# Patient Record
Sex: Male | Born: 1935
Health system: Southern US, Community
[De-identification: ages and names within clinical notes are randomized; demographics above are authoritative.]

## PROBLEM LIST (undated history)

## (undated) DIAGNOSIS — C61 Malignant neoplasm of prostate: Secondary | ICD-10-CM

## (undated) DIAGNOSIS — K219 Gastro-esophageal reflux disease without esophagitis: Secondary | ICD-10-CM

## (undated) DIAGNOSIS — F32A Depression, unspecified: Secondary | ICD-10-CM

## (undated) DIAGNOSIS — I10 Essential (primary) hypertension: Secondary | ICD-10-CM

## (undated) DIAGNOSIS — J40 Bronchitis, not specified as acute or chronic: Secondary | ICD-10-CM

## (undated) DIAGNOSIS — F329 Major depressive disorder, single episode, unspecified: Secondary | ICD-10-CM

## (undated) DIAGNOSIS — E7800 Pure hypercholesterolemia, unspecified: Secondary | ICD-10-CM

## (undated) DIAGNOSIS — E119 Type 2 diabetes mellitus without complications: Secondary | ICD-10-CM

## (undated) DIAGNOSIS — F419 Anxiety disorder, unspecified: Secondary | ICD-10-CM

## (undated) DIAGNOSIS — N200 Calculus of kidney: Secondary | ICD-10-CM

## (undated) HISTORY — PX: OTHER SURGICAL HISTORY: SHX169

## (undated) HISTORY — DX: Pure hypercholesterolemia, unspecified: E78.00

## (undated) HISTORY — DX: Malignant neoplasm of prostate: C61

## (undated) HISTORY — PX: TONSILLECTOMY: SUR1361

## (undated) HISTORY — DX: Type 2 diabetes mellitus without complications: E11.9

## (undated) HISTORY — DX: Essential (primary) hypertension: I10

## (undated) HISTORY — PX: KIDNEY STONE SURGERY: SHX686

## (undated) HISTORY — DX: Calculus of kidney: N20.0

---

## 2010-06-10 ENCOUNTER — Encounter: Admission: RE | Admit: 2010-06-10 | Discharge: 2010-06-10 | Payer: Self-pay | Admitting: Orthopedic Surgery

## 2011-03-10 DIAGNOSIS — E7800 Pure hypercholesterolemia, unspecified: Secondary | ICD-10-CM | POA: Insufficient documentation

## 2011-03-10 DIAGNOSIS — E1141 Type 2 diabetes mellitus with diabetic mononeuropathy: Secondary | ICD-10-CM | POA: Insufficient documentation

## 2011-03-10 DIAGNOSIS — I10 Essential (primary) hypertension: Secondary | ICD-10-CM

## 2011-03-10 DIAGNOSIS — N200 Calculus of kidney: Secondary | ICD-10-CM

## 2011-03-10 DIAGNOSIS — E1140 Type 2 diabetes mellitus with diabetic neuropathy, unspecified: Secondary | ICD-10-CM | POA: Insufficient documentation

## 2011-03-10 DIAGNOSIS — C61 Malignant neoplasm of prostate: Secondary | ICD-10-CM | POA: Insufficient documentation

## 2011-08-04 IMAGING — CT CT EXTREM UP W/O CM*L*
2 of 4 series · 4 of 14 positions shown, 5 images · non-contrast
Comparison: None.

CLINICAL DATA: Status post fall 06/07/2010.  Evaluate proximal
left humeral fracture.  History of prostate cancer.

CT OF THE LEFT SHOULDER WITHOUT CONTRAST
TECHNIQUE: Multidetector CT imaging of the left shoulder was
performed according to the standard protocol without intravenous
contrast. Multiplanar CT image reconstructions were also generated.

[Series 3: shoulder bone · axial · 0.49mm/px · z∈[-156,-79]mm · 2 of 94 slices shown, 3 images]
[im 32/94  soft-tissue]
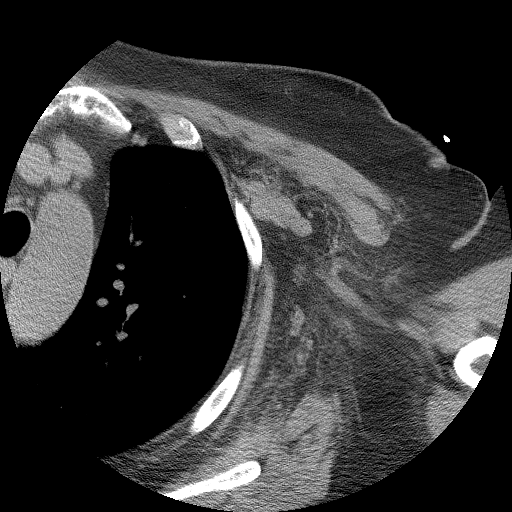
[im 32/94  bone]
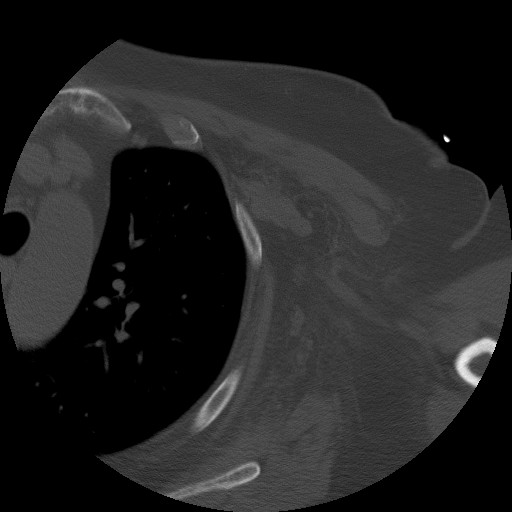
[im 63/94  bone]
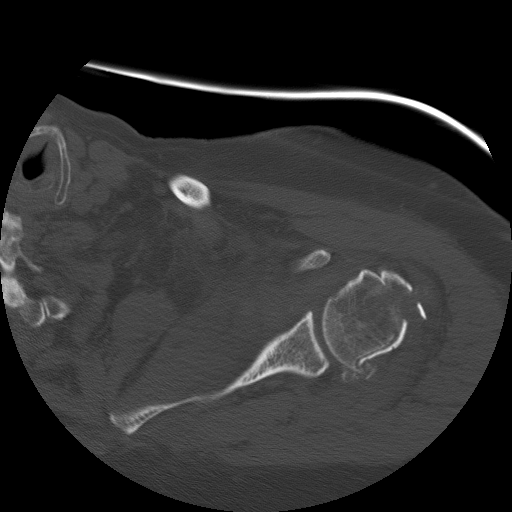

[Series 103: sag obl · sagittal · 0.53mm/px · 2 of 112 slices shown]
[im 38/112  bone]
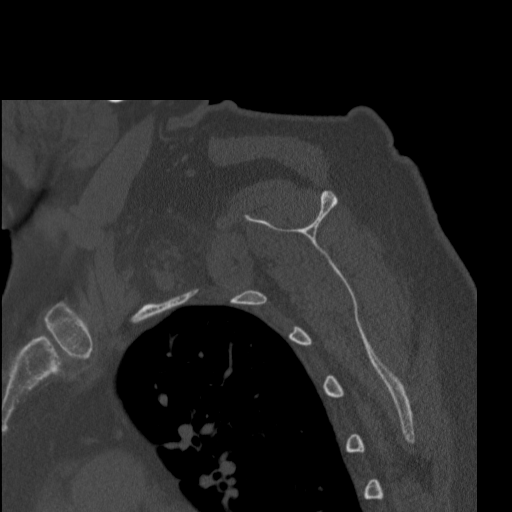
[im 75/112  bone]
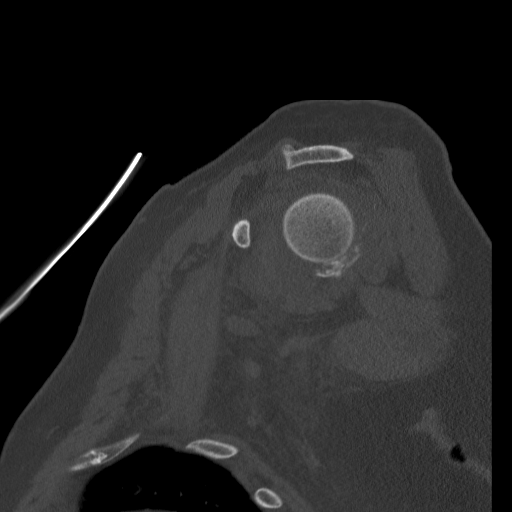

[4 of 14 positions shown; findings below may reference images not displayed]

FINDINGS: There is a comminuted fracture of the left humeral neck
and proximal diaphysis.  Components involving the greater
tuberosity are mildly displaced.  No significant involvement of the
articular surface of the humeral head is demonstrated.  There are
small intra-articular fracture fragments within the axillary
recess.

There is no evidence of scapular or clavicle fracture.  There is no
humeral head subluxation.  There is some soft tissue swelling
surrounding the left shoulder joint.

No upper rib fractures are identified.  There is a tiny nodule in
the lingula adjacent to one of the pulmonary vessels, measuring 4
mm on image 77.  The lungs are otherwise clear.
IMPRESSION: 1.  Comminuted and mildly displaced fracture of the left humeral
neck as described.  No significant involvement of the articular
surface of the humeral head is identified.
2.  There are small fracture fragments within the joint.
3.  No evidence of scapular or clavicle fracture.
4.  Tiny lingular nodule is of doubtful significance.  If the
patient is at high risk for bronchogenic carcinoma, follow-up chest
CT at 1 year is recommended.  If the patient is at low risk, no
follow-up is needed.  This recommendation follows the consensus
statement: Guidelines for Management of Small Pulmonary Nodules
Detected on CT Scans:  A Statement from the [HOSPITAL] as
[URL] No
sclerotic bone lesions are identified.

3-DIMENSIONAL CT IMAGE RENDERING AT INDEPENDENT WORKSTATION:

3-dimensional CT images were rendered by post-processing of the
original CT data at independent workstation.  The 3-dimensional CT
images were interpreted, and findings were reported in the
accompanying complete CT report for this study.

## 2011-08-18 DIAGNOSIS — L608 Other nail disorders: Secondary | ICD-10-CM | POA: Diagnosis not present

## 2011-08-18 DIAGNOSIS — E1149 Type 2 diabetes mellitus with other diabetic neurological complication: Secondary | ICD-10-CM | POA: Diagnosis not present

## 2011-10-20 DIAGNOSIS — M1710 Unilateral primary osteoarthritis, unspecified knee: Secondary | ICD-10-CM | POA: Diagnosis not present

## 2011-10-20 DIAGNOSIS — IMO0002 Reserved for concepts with insufficient information to code with codable children: Secondary | ICD-10-CM | POA: Diagnosis not present

## 2011-11-01 DIAGNOSIS — Z6838 Body mass index (BMI) 38.0-38.9, adult: Secondary | ICD-10-CM | POA: Diagnosis not present

## 2011-11-01 DIAGNOSIS — E119 Type 2 diabetes mellitus without complications: Secondary | ICD-10-CM | POA: Diagnosis not present

## 2011-11-01 DIAGNOSIS — E785 Hyperlipidemia, unspecified: Secondary | ICD-10-CM | POA: Diagnosis not present

## 2011-11-01 DIAGNOSIS — IMO0001 Reserved for inherently not codable concepts without codable children: Secondary | ICD-10-CM | POA: Diagnosis not present

## 2011-11-01 DIAGNOSIS — Z79899 Other long term (current) drug therapy: Secondary | ICD-10-CM | POA: Diagnosis not present

## 2011-11-01 DIAGNOSIS — G4700 Insomnia, unspecified: Secondary | ICD-10-CM | POA: Diagnosis not present

## 2011-11-17 DIAGNOSIS — IMO0002 Reserved for concepts with insufficient information to code with codable children: Secondary | ICD-10-CM | POA: Diagnosis not present

## 2011-11-17 DIAGNOSIS — R233 Spontaneous ecchymoses: Secondary | ICD-10-CM | POA: Diagnosis not present

## 2011-11-17 DIAGNOSIS — M1710 Unilateral primary osteoarthritis, unspecified knee: Secondary | ICD-10-CM | POA: Diagnosis not present

## 2011-11-17 DIAGNOSIS — L821 Other seborrheic keratosis: Secondary | ICD-10-CM | POA: Diagnosis not present

## 2011-11-17 DIAGNOSIS — L820 Inflamed seborrheic keratosis: Secondary | ICD-10-CM | POA: Diagnosis not present

## 2011-12-01 DIAGNOSIS — E1149 Type 2 diabetes mellitus with other diabetic neurological complication: Secondary | ICD-10-CM | POA: Diagnosis not present

## 2011-12-01 DIAGNOSIS — L608 Other nail disorders: Secondary | ICD-10-CM | POA: Diagnosis not present

## 2012-02-08 DIAGNOSIS — E119 Type 2 diabetes mellitus without complications: Secondary | ICD-10-CM | POA: Diagnosis not present

## 2012-02-08 DIAGNOSIS — C61 Malignant neoplasm of prostate: Secondary | ICD-10-CM | POA: Diagnosis not present

## 2012-02-08 DIAGNOSIS — I10 Essential (primary) hypertension: Secondary | ICD-10-CM | POA: Diagnosis not present

## 2012-02-08 DIAGNOSIS — Z6838 Body mass index (BMI) 38.0-38.9, adult: Secondary | ICD-10-CM | POA: Diagnosis not present

## 2012-02-08 DIAGNOSIS — E785 Hyperlipidemia, unspecified: Secondary | ICD-10-CM | POA: Diagnosis not present

## 2012-02-21 DIAGNOSIS — C61 Malignant neoplasm of prostate: Secondary | ICD-10-CM | POA: Diagnosis not present

## 2012-02-28 DIAGNOSIS — C61 Malignant neoplasm of prostate: Secondary | ICD-10-CM | POA: Diagnosis not present

## 2012-02-28 DIAGNOSIS — Z87442 Personal history of urinary calculi: Secondary | ICD-10-CM | POA: Diagnosis not present

## 2012-03-01 DIAGNOSIS — L608 Other nail disorders: Secondary | ICD-10-CM | POA: Diagnosis not present

## 2012-03-01 DIAGNOSIS — E1149 Type 2 diabetes mellitus with other diabetic neurological complication: Secondary | ICD-10-CM | POA: Diagnosis not present

## 2012-03-19 DIAGNOSIS — E119 Type 2 diabetes mellitus without complications: Secondary | ICD-10-CM | POA: Diagnosis not present

## 2012-05-23 DIAGNOSIS — Z23 Encounter for immunization: Secondary | ICD-10-CM | POA: Diagnosis not present

## 2012-05-30 DIAGNOSIS — E119 Type 2 diabetes mellitus without complications: Secondary | ICD-10-CM | POA: Diagnosis not present

## 2012-05-30 DIAGNOSIS — C61 Malignant neoplasm of prostate: Secondary | ICD-10-CM | POA: Diagnosis not present

## 2012-05-30 DIAGNOSIS — E785 Hyperlipidemia, unspecified: Secondary | ICD-10-CM | POA: Diagnosis not present

## 2012-05-30 DIAGNOSIS — I10 Essential (primary) hypertension: Secondary | ICD-10-CM | POA: Diagnosis not present

## 2012-06-14 DIAGNOSIS — L608 Other nail disorders: Secondary | ICD-10-CM | POA: Diagnosis not present

## 2012-06-14 DIAGNOSIS — E1149 Type 2 diabetes mellitus with other diabetic neurological complication: Secondary | ICD-10-CM | POA: Diagnosis not present

## 2012-07-19 DIAGNOSIS — H6120 Impacted cerumen, unspecified ear: Secondary | ICD-10-CM | POA: Diagnosis not present

## 2012-07-19 DIAGNOSIS — E119 Type 2 diabetes mellitus without complications: Secondary | ICD-10-CM | POA: Diagnosis not present

## 2012-07-19 DIAGNOSIS — E785 Hyperlipidemia, unspecified: Secondary | ICD-10-CM | POA: Diagnosis not present

## 2012-07-19 DIAGNOSIS — I10 Essential (primary) hypertension: Secondary | ICD-10-CM | POA: Diagnosis not present

## 2012-07-19 DIAGNOSIS — E1149 Type 2 diabetes mellitus with other diabetic neurological complication: Secondary | ICD-10-CM | POA: Diagnosis not present

## 2012-09-18 DIAGNOSIS — E785 Hyperlipidemia, unspecified: Secondary | ICD-10-CM | POA: Diagnosis not present

## 2012-09-18 DIAGNOSIS — I10 Essential (primary) hypertension: Secondary | ICD-10-CM | POA: Diagnosis not present

## 2012-09-18 DIAGNOSIS — Z9181 History of falling: Secondary | ICD-10-CM | POA: Diagnosis not present

## 2012-09-18 DIAGNOSIS — C61 Malignant neoplasm of prostate: Secondary | ICD-10-CM | POA: Diagnosis not present

## 2012-09-18 DIAGNOSIS — Z1331 Encounter for screening for depression: Secondary | ICD-10-CM | POA: Diagnosis not present

## 2012-09-18 DIAGNOSIS — Z6837 Body mass index (BMI) 37.0-37.9, adult: Secondary | ICD-10-CM | POA: Diagnosis not present

## 2012-09-18 DIAGNOSIS — R809 Proteinuria, unspecified: Secondary | ICD-10-CM | POA: Diagnosis not present

## 2012-09-18 DIAGNOSIS — E119 Type 2 diabetes mellitus without complications: Secondary | ICD-10-CM | POA: Diagnosis not present

## 2012-10-11 DIAGNOSIS — E1149 Type 2 diabetes mellitus with other diabetic neurological complication: Secondary | ICD-10-CM | POA: Diagnosis not present

## 2012-10-11 DIAGNOSIS — L608 Other nail disorders: Secondary | ICD-10-CM | POA: Diagnosis not present

## 2012-11-12 DIAGNOSIS — R159 Full incontinence of feces: Secondary | ICD-10-CM | POA: Diagnosis not present

## 2012-11-12 DIAGNOSIS — R197 Diarrhea, unspecified: Secondary | ICD-10-CM | POA: Diagnosis not present

## 2012-11-12 DIAGNOSIS — A088 Other specified intestinal infections: Secondary | ICD-10-CM | POA: Diagnosis not present

## 2012-11-12 DIAGNOSIS — Z6837 Body mass index (BMI) 37.0-37.9, adult: Secondary | ICD-10-CM | POA: Diagnosis not present

## 2012-11-13 DIAGNOSIS — R197 Diarrhea, unspecified: Secondary | ICD-10-CM | POA: Diagnosis not present

## 2012-11-16 DIAGNOSIS — L578 Other skin changes due to chronic exposure to nonionizing radiation: Secondary | ICD-10-CM | POA: Diagnosis not present

## 2012-11-16 DIAGNOSIS — L821 Other seborrheic keratosis: Secondary | ICD-10-CM | POA: Diagnosis not present

## 2012-11-16 DIAGNOSIS — C44221 Squamous cell carcinoma of skin of unspecified ear and external auricular canal: Secondary | ICD-10-CM | POA: Diagnosis not present

## 2013-01-08 DIAGNOSIS — C61 Malignant neoplasm of prostate: Secondary | ICD-10-CM | POA: Diagnosis not present

## 2013-01-08 DIAGNOSIS — E785 Hyperlipidemia, unspecified: Secondary | ICD-10-CM | POA: Diagnosis not present

## 2013-01-08 DIAGNOSIS — Z6837 Body mass index (BMI) 37.0-37.9, adult: Secondary | ICD-10-CM | POA: Diagnosis not present

## 2013-01-08 DIAGNOSIS — I10 Essential (primary) hypertension: Secondary | ICD-10-CM | POA: Diagnosis not present

## 2013-01-08 DIAGNOSIS — E119 Type 2 diabetes mellitus without complications: Secondary | ICD-10-CM | POA: Diagnosis not present

## 2013-01-14 DIAGNOSIS — G609 Hereditary and idiopathic neuropathy, unspecified: Secondary | ICD-10-CM | POA: Diagnosis not present

## 2013-01-14 DIAGNOSIS — I498 Other specified cardiac arrhythmias: Secondary | ICD-10-CM | POA: Diagnosis not present

## 2013-01-14 DIAGNOSIS — K222 Esophageal obstruction: Secondary | ICD-10-CM | POA: Diagnosis not present

## 2013-01-14 DIAGNOSIS — IMO0002 Reserved for concepts with insufficient information to code with codable children: Secondary | ICD-10-CM | POA: Diagnosis not present

## 2013-01-14 DIAGNOSIS — T18108A Unspecified foreign body in esophagus causing other injury, initial encounter: Secondary | ICD-10-CM | POA: Diagnosis not present

## 2013-01-14 DIAGNOSIS — I10 Essential (primary) hypertension: Secondary | ICD-10-CM | POA: Diagnosis not present

## 2013-01-14 DIAGNOSIS — E119 Type 2 diabetes mellitus without complications: Secondary | ICD-10-CM | POA: Diagnosis not present

## 2013-01-14 DIAGNOSIS — G4733 Obstructive sleep apnea (adult) (pediatric): Secondary | ICD-10-CM | POA: Diagnosis not present

## 2013-01-14 DIAGNOSIS — T189XXA Foreign body of alimentary tract, part unspecified, initial encounter: Secondary | ICD-10-CM | POA: Diagnosis not present

## 2013-01-14 DIAGNOSIS — K209 Esophagitis, unspecified without bleeding: Secondary | ICD-10-CM | POA: Diagnosis not present

## 2013-01-14 DIAGNOSIS — R1310 Dysphagia, unspecified: Secondary | ICD-10-CM | POA: Diagnosis not present

## 2013-01-14 DIAGNOSIS — E669 Obesity, unspecified: Secondary | ICD-10-CM | POA: Diagnosis not present

## 2013-01-18 DIAGNOSIS — K222 Esophageal obstruction: Secondary | ICD-10-CM | POA: Diagnosis not present

## 2013-01-21 DIAGNOSIS — I10 Essential (primary) hypertension: Secondary | ICD-10-CM | POA: Diagnosis not present

## 2013-01-21 DIAGNOSIS — I498 Other specified cardiac arrhythmias: Secondary | ICD-10-CM | POA: Diagnosis not present

## 2013-01-22 DIAGNOSIS — Z01810 Encounter for preprocedural cardiovascular examination: Secondary | ICD-10-CM | POA: Diagnosis not present

## 2013-01-22 DIAGNOSIS — E119 Type 2 diabetes mellitus without complications: Secondary | ICD-10-CM | POA: Diagnosis not present

## 2013-01-22 DIAGNOSIS — Z833 Family history of diabetes mellitus: Secondary | ICD-10-CM | POA: Diagnosis not present

## 2013-01-22 DIAGNOSIS — I10 Essential (primary) hypertension: Secondary | ICD-10-CM | POA: Diagnosis not present

## 2013-01-22 DIAGNOSIS — Z8249 Family history of ischemic heart disease and other diseases of the circulatory system: Secondary | ICD-10-CM | POA: Diagnosis not present

## 2013-01-23 DIAGNOSIS — L608 Other nail disorders: Secondary | ICD-10-CM | POA: Diagnosis not present

## 2013-01-23 DIAGNOSIS — E1149 Type 2 diabetes mellitus with other diabetic neurological complication: Secondary | ICD-10-CM | POA: Diagnosis not present

## 2013-01-24 DIAGNOSIS — I10 Essential (primary) hypertension: Secondary | ICD-10-CM | POA: Diagnosis not present

## 2013-01-24 DIAGNOSIS — E119 Type 2 diabetes mellitus without complications: Secondary | ICD-10-CM | POA: Diagnosis not present

## 2013-01-24 DIAGNOSIS — Z01810 Encounter for preprocedural cardiovascular examination: Secondary | ICD-10-CM | POA: Diagnosis not present

## 2013-01-31 DIAGNOSIS — Z8546 Personal history of malignant neoplasm of prostate: Secondary | ICD-10-CM | POA: Diagnosis not present

## 2013-02-04 DIAGNOSIS — Z6837 Body mass index (BMI) 37.0-37.9, adult: Secondary | ICD-10-CM | POA: Diagnosis not present

## 2013-02-04 DIAGNOSIS — I498 Other specified cardiac arrhythmias: Secondary | ICD-10-CM | POA: Diagnosis not present

## 2013-02-04 DIAGNOSIS — I10 Essential (primary) hypertension: Secondary | ICD-10-CM | POA: Diagnosis not present

## 2013-02-07 DIAGNOSIS — Z87442 Personal history of urinary calculi: Secondary | ICD-10-CM | POA: Diagnosis not present

## 2013-02-07 DIAGNOSIS — N200 Calculus of kidney: Secondary | ICD-10-CM | POA: Diagnosis not present

## 2013-02-07 DIAGNOSIS — Z8546 Personal history of malignant neoplasm of prostate: Secondary | ICD-10-CM | POA: Diagnosis not present

## 2013-02-12 DIAGNOSIS — Z01810 Encounter for preprocedural cardiovascular examination: Secondary | ICD-10-CM | POA: Diagnosis not present

## 2013-02-12 DIAGNOSIS — E119 Type 2 diabetes mellitus without complications: Secondary | ICD-10-CM | POA: Diagnosis not present

## 2013-02-12 DIAGNOSIS — I10 Essential (primary) hypertension: Secondary | ICD-10-CM | POA: Diagnosis not present

## 2013-02-20 DIAGNOSIS — R1310 Dysphagia, unspecified: Secondary | ICD-10-CM | POA: Diagnosis not present

## 2013-02-20 DIAGNOSIS — K222 Esophageal obstruction: Secondary | ICD-10-CM | POA: Diagnosis not present

## 2013-02-20 DIAGNOSIS — Q391 Atresia of esophagus with tracheo-esophageal fistula: Secondary | ICD-10-CM | POA: Diagnosis not present

## 2013-02-20 DIAGNOSIS — R1314 Dysphagia, pharyngoesophageal phase: Secondary | ICD-10-CM | POA: Diagnosis not present

## 2013-03-26 DIAGNOSIS — Z1211 Encounter for screening for malignant neoplasm of colon: Secondary | ICD-10-CM | POA: Diagnosis not present

## 2013-03-26 DIAGNOSIS — K222 Esophageal obstruction: Secondary | ICD-10-CM | POA: Diagnosis not present

## 2013-04-18 DIAGNOSIS — I10 Essential (primary) hypertension: Secondary | ICD-10-CM | POA: Diagnosis not present

## 2013-04-18 DIAGNOSIS — C61 Malignant neoplasm of prostate: Secondary | ICD-10-CM | POA: Diagnosis not present

## 2013-04-18 DIAGNOSIS — Z6837 Body mass index (BMI) 37.0-37.9, adult: Secondary | ICD-10-CM | POA: Diagnosis not present

## 2013-04-18 DIAGNOSIS — E785 Hyperlipidemia, unspecified: Secondary | ICD-10-CM | POA: Diagnosis not present

## 2013-04-18 DIAGNOSIS — E119 Type 2 diabetes mellitus without complications: Secondary | ICD-10-CM | POA: Diagnosis not present

## 2013-04-24 DIAGNOSIS — L608 Other nail disorders: Secondary | ICD-10-CM | POA: Diagnosis not present

## 2013-04-24 DIAGNOSIS — E1149 Type 2 diabetes mellitus with other diabetic neurological complication: Secondary | ICD-10-CM | POA: Diagnosis not present

## 2013-04-26 DIAGNOSIS — Z6837 Body mass index (BMI) 37.0-37.9, adult: Secondary | ICD-10-CM | POA: Diagnosis not present

## 2013-04-26 DIAGNOSIS — M47812 Spondylosis without myelopathy or radiculopathy, cervical region: Secondary | ICD-10-CM | POA: Diagnosis not present

## 2013-04-29 DIAGNOSIS — M47812 Spondylosis without myelopathy or radiculopathy, cervical region: Secondary | ICD-10-CM | POA: Diagnosis not present

## 2013-05-23 DIAGNOSIS — E119 Type 2 diabetes mellitus without complications: Secondary | ICD-10-CM | POA: Diagnosis not present

## 2013-05-23 DIAGNOSIS — H5200 Hypermetropia, unspecified eye: Secondary | ICD-10-CM | POA: Diagnosis not present

## 2013-05-23 DIAGNOSIS — H259 Unspecified age-related cataract: Secondary | ICD-10-CM | POA: Diagnosis not present

## 2013-05-23 DIAGNOSIS — I10 Essential (primary) hypertension: Secondary | ICD-10-CM | POA: Diagnosis not present

## 2013-05-24 DIAGNOSIS — I10 Essential (primary) hypertension: Secondary | ICD-10-CM | POA: Diagnosis not present

## 2013-05-24 DIAGNOSIS — Z6838 Body mass index (BMI) 38.0-38.9, adult: Secondary | ICD-10-CM | POA: Diagnosis not present

## 2013-05-24 DIAGNOSIS — Z23 Encounter for immunization: Secondary | ICD-10-CM | POA: Diagnosis not present

## 2013-06-07 DIAGNOSIS — I10 Essential (primary) hypertension: Secondary | ICD-10-CM | POA: Diagnosis not present

## 2013-07-23 DIAGNOSIS — C61 Malignant neoplasm of prostate: Secondary | ICD-10-CM | POA: Diagnosis not present

## 2013-07-23 DIAGNOSIS — I10 Essential (primary) hypertension: Secondary | ICD-10-CM | POA: Diagnosis not present

## 2013-07-23 DIAGNOSIS — Z23 Encounter for immunization: Secondary | ICD-10-CM | POA: Diagnosis not present

## 2013-07-23 DIAGNOSIS — E785 Hyperlipidemia, unspecified: Secondary | ICD-10-CM | POA: Diagnosis not present

## 2013-07-23 DIAGNOSIS — E1149 Type 2 diabetes mellitus with other diabetic neurological complication: Secondary | ICD-10-CM | POA: Diagnosis not present

## 2013-07-23 DIAGNOSIS — E119 Type 2 diabetes mellitus without complications: Secondary | ICD-10-CM | POA: Diagnosis not present

## 2013-07-29 DIAGNOSIS — J019 Acute sinusitis, unspecified: Secondary | ICD-10-CM | POA: Diagnosis not present

## 2013-07-29 DIAGNOSIS — R509 Fever, unspecified: Secondary | ICD-10-CM | POA: Diagnosis not present

## 2013-08-09 DIAGNOSIS — E119 Type 2 diabetes mellitus without complications: Secondary | ICD-10-CM | POA: Diagnosis not present

## 2013-08-09 DIAGNOSIS — H6120 Impacted cerumen, unspecified ear: Secondary | ICD-10-CM | POA: Diagnosis not present

## 2013-08-09 DIAGNOSIS — J209 Acute bronchitis, unspecified: Secondary | ICD-10-CM | POA: Diagnosis not present

## 2013-08-09 DIAGNOSIS — J019 Acute sinusitis, unspecified: Secondary | ICD-10-CM | POA: Diagnosis not present

## 2013-08-09 DIAGNOSIS — Z6837 Body mass index (BMI) 37.0-37.9, adult: Secondary | ICD-10-CM | POA: Diagnosis not present

## 2013-08-14 ENCOUNTER — Ambulatory Visit: Payer: Self-pay

## 2013-08-28 ENCOUNTER — Ambulatory Visit (INDEPENDENT_AMBULATORY_CARE_PROVIDER_SITE_OTHER): Payer: Medicare Other

## 2013-08-28 VITALS — BP 169/78 | HR 59 | Resp 18

## 2013-08-28 DIAGNOSIS — E1149 Type 2 diabetes mellitus with other diabetic neurological complication: Secondary | ICD-10-CM

## 2013-08-28 DIAGNOSIS — E1142 Type 2 diabetes mellitus with diabetic polyneuropathy: Secondary | ICD-10-CM

## 2013-08-28 DIAGNOSIS — L608 Other nail disorders: Secondary | ICD-10-CM

## 2013-08-28 DIAGNOSIS — E1140 Type 2 diabetes mellitus with diabetic neuropathy, unspecified: Secondary | ICD-10-CM

## 2013-08-28 NOTE — Patient Instructions (Signed)
Diabetes and Foot Care Diabetes may cause you to have problems because of poor blood supply (circulation) to your feet and legs. This may cause the skin on your feet to become thinner, break easier, and heal more slowly. Your skin may become dry, and the skin may peel and crack. You may also have nerve damage in your legs and feet causing decreased feeling in them. You may not notice minor injuries to your feet that could lead to infections or more serious problems. Taking care of your feet is one of the most important things you can do for yourself.  HOME CARE INSTRUCTIONS  Wear shoes at all times, even in the house. Do not go barefoot. Bare feet are easily injured.  Check your feet daily for blisters, cuts, and redness. If you cannot see the bottom of your feet, use a mirror or ask someone for help.  Wash your feet with warm water (do not use hot water) and mild soap. Then pat your feet and the areas between your toes until they are completely dry. Do not soak your feet as this can dry your skin.  Apply a moisturizing lotion or petroleum jelly (that does not contain alcohol and is unscented) to the skin on your feet and to dry, brittle toenails. Do not apply lotion between your toes.  Trim your toenails straight across. Do not dig under them or around the cuticle. File the edges of your nails with an emery board or nail file.  Do not cut corns or calluses or try to remove them with medicine.  Wear clean socks or stockings every day. Make sure they are not too tight. Do not wear knee-high stockings since they may decrease blood flow to your legs.  Wear shoes that fit properly and have enough cushioning. To break in new shoes, wear them for just a few hours a day. This prevents you from injuring your feet. Always look in your shoes before you put them on to be sure there are no objects inside.  Do not cross your legs. This may decrease the blood flow to your feet.  If you find a minor scrape,  cut, or break in the skin on your feet, keep it and the skin around it clean and dry. These areas may be cleansed with mild soap and water. Do not cleanse the area with peroxide, alcohol, or iodine.  When you remove an adhesive bandage, be sure not to damage the skin around it.  If you have a wound, look at it several times a day to make sure it is healing.  Do not use heating pads or hot water bottles. They may burn your skin. If you have lost feeling in your feet or legs, you may not know it is happening until it is too late.  Make sure your health care provider performs a complete foot exam at least annually or more often if you have foot problems. Report any cuts, sores, or bruises to your health care provider immediately. SEEK MEDICAL CARE IF:   You have an injury that is not healing.  You have cuts or breaks in the skin.  You have an ingrown nail.  You notice redness on your legs or feet.  You feel burning or tingling in your legs or feet.  You have pain or cramps in your legs and feet.  Your legs or feet are numb.  Your feet always feel cold. SEEK IMMEDIATE MEDICAL CARE IF:   There is increasing redness,   swelling, or pain in or around a wound.  There is a red line that goes up your leg.  Pus is coming from a wound.  You develop a fever or as directed by your health care provider.  You notice a bad smell coming from an ulcer or wound. Document Released: 07/22/2000 Document Revised: 03/27/2013 Document Reviewed: 01/01/2013 Exit[employer] Patient Information 2014 [employer].  

## 2013-08-28 NOTE — Progress Notes (Signed)
   Subjective:    Patient ID: [name], male    DOB: 09/12/1934, 79 y.o.   MRN: [id number]  HPI I am here to get my toenails trimmed    Review of Systems deferred at this visit     Objective:   Physical Exam Neurovascular status is intact as follows DP +2/4 PT plus one over 4 bilateral Refill timed 3-4 seconds all digits. Skin temperature warm turgor normal no edema rubor pallor mild varicosities noted. Neurologically epicritic and proprioceptive sensations are diminished on Semmes Weinstein testing absent sensation to the digits and plantar forefoot and medial arch. Nails thick brittle friable discolored tender in ingrowing. No secondary infection is no open sores no ulcerations at current exam. Dermatologically skin color pigment normal hair growth absent during debridement today a slight maceration was done to the right hallux plantar area which is treated with lumicain Neosporin and Band-Aid to be For 24 Hours.      Assessment & Plan:  Assessment diabetes with peripheral neuropathy and other complications. Painful thick deformed mycotic nails debrided x10 the presence of diabetes and complications return for palliative nail care in 3 months or as needed  Harriet Masson DPM

## 2013-09-19 DIAGNOSIS — J019 Acute sinusitis, unspecified: Secondary | ICD-10-CM | POA: Diagnosis not present

## 2013-09-19 DIAGNOSIS — Z6837 Body mass index (BMI) 37.0-37.9, adult: Secondary | ICD-10-CM | POA: Diagnosis not present

## 2013-10-17 DIAGNOSIS — E785 Hyperlipidemia, unspecified: Secondary | ICD-10-CM | POA: Diagnosis not present

## 2013-10-17 DIAGNOSIS — E119 Type 2 diabetes mellitus without complications: Secondary | ICD-10-CM | POA: Diagnosis not present

## 2013-10-17 DIAGNOSIS — C61 Malignant neoplasm of prostate: Secondary | ICD-10-CM | POA: Diagnosis not present

## 2013-10-22 DIAGNOSIS — I10 Essential (primary) hypertension: Secondary | ICD-10-CM | POA: Diagnosis not present

## 2013-10-22 DIAGNOSIS — E119 Type 2 diabetes mellitus without complications: Secondary | ICD-10-CM | POA: Diagnosis not present

## 2013-10-22 DIAGNOSIS — E785 Hyperlipidemia, unspecified: Secondary | ICD-10-CM | POA: Diagnosis not present

## 2013-10-22 DIAGNOSIS — E1149 Type 2 diabetes mellitus with other diabetic neurological complication: Secondary | ICD-10-CM | POA: Diagnosis not present

## 2013-11-14 DIAGNOSIS — M545 Low back pain, unspecified: Secondary | ICD-10-CM | POA: Diagnosis not present

## 2013-11-14 DIAGNOSIS — M76899 Other specified enthesopathies of unspecified lower limb, excluding foot: Secondary | ICD-10-CM | POA: Diagnosis not present

## 2013-11-14 DIAGNOSIS — M25559 Pain in unspecified hip: Secondary | ICD-10-CM | POA: Diagnosis not present

## 2013-11-14 DIAGNOSIS — M5137 Other intervertebral disc degeneration, lumbosacral region: Secondary | ICD-10-CM | POA: Diagnosis not present

## 2013-11-18 DIAGNOSIS — L821 Other seborrheic keratosis: Secondary | ICD-10-CM | POA: Diagnosis not present

## 2013-11-18 DIAGNOSIS — C44201 Unspecified malignant neoplasm of skin of unspecified ear and external auricular canal: Secondary | ICD-10-CM | POA: Diagnosis not present

## 2013-11-18 DIAGNOSIS — R233 Spontaneous ecchymoses: Secondary | ICD-10-CM | POA: Diagnosis not present

## 2013-11-19 DIAGNOSIS — M6281 Muscle weakness (generalized): Secondary | ICD-10-CM | POA: Diagnosis not present

## 2013-11-19 DIAGNOSIS — M5137 Other intervertebral disc degeneration, lumbosacral region: Secondary | ICD-10-CM | POA: Diagnosis not present

## 2013-11-19 DIAGNOSIS — R269 Unspecified abnormalities of gait and mobility: Secondary | ICD-10-CM | POA: Diagnosis not present

## 2013-11-25 DIAGNOSIS — M5137 Other intervertebral disc degeneration, lumbosacral region: Secondary | ICD-10-CM | POA: Diagnosis not present

## 2013-11-25 DIAGNOSIS — M6281 Muscle weakness (generalized): Secondary | ICD-10-CM | POA: Diagnosis not present

## 2013-11-25 DIAGNOSIS — R269 Unspecified abnormalities of gait and mobility: Secondary | ICD-10-CM | POA: Diagnosis not present

## 2013-11-27 ENCOUNTER — Ambulatory Visit (INDEPENDENT_AMBULATORY_CARE_PROVIDER_SITE_OTHER): Payer: Medicare Other

## 2013-11-27 VITALS — BP 183/87 | HR 71 | Resp 18

## 2013-11-27 DIAGNOSIS — R269 Unspecified abnormalities of gait and mobility: Secondary | ICD-10-CM | POA: Diagnosis not present

## 2013-11-27 DIAGNOSIS — E1140 Type 2 diabetes mellitus with diabetic neuropathy, unspecified: Secondary | ICD-10-CM

## 2013-11-27 DIAGNOSIS — E1149 Type 2 diabetes mellitus with other diabetic neurological complication: Secondary | ICD-10-CM | POA: Diagnosis not present

## 2013-11-27 DIAGNOSIS — M6281 Muscle weakness (generalized): Secondary | ICD-10-CM | POA: Diagnosis not present

## 2013-11-27 DIAGNOSIS — L608 Other nail disorders: Secondary | ICD-10-CM

## 2013-11-27 DIAGNOSIS — M5137 Other intervertebral disc degeneration, lumbosacral region: Secondary | ICD-10-CM | POA: Diagnosis not present

## 2013-11-27 NOTE — Patient Instructions (Signed)
Diabetes and Foot Care Diabetes may cause you to have problems because of poor blood supply (circulation) to your feet and legs. This may cause the skin on your feet to become thinner, break easier, and heal more slowly. Your skin may become dry, and the skin may peel and crack. You may also have nerve damage in your legs and feet causing decreased feeling in them. You may not notice minor injuries to your feet that could lead to infections or more serious problems. Taking care of your feet is one of the most important things you can do for yourself.  HOME CARE INSTRUCTIONS  Wear shoes at all times, even in the house. Do not go barefoot. Bare feet are easily injured.  Check your feet daily for blisters, cuts, and redness. If you cannot see the bottom of your feet, use a mirror or ask someone for help.  Wash your feet with warm water (do not use hot water) and mild soap. Then pat your feet and the areas between your toes until they are completely dry. Do not soak your feet as this can dry your skin.  Apply a moisturizing lotion or petroleum jelly (that does not contain alcohol and is unscented) to the skin on your feet and to dry, brittle toenails. Do not apply lotion between your toes.  Trim your toenails straight across. Do not dig under them or around the cuticle. File the edges of your nails with an emery board or nail file.  Do not cut corns or calluses or try to remove them with medicine.  Wear clean socks or stockings every day. Make sure they are not too tight. Do not wear knee-high stockings since they may decrease blood flow to your legs.  Wear shoes that fit properly and have enough cushioning. To break in new shoes, wear them for just a few hours a day. This prevents you from injuring your feet. Always look in your shoes before you put them on to be sure there are no objects inside.  Do not cross your legs. This may decrease the blood flow to your feet.  If you find a minor scrape,  cut, or break in the skin on your feet, keep it and the skin around it clean and dry. These areas may be cleansed with mild soap and water. Do not cleanse the area with peroxide, alcohol, or iodine.  When you remove an adhesive bandage, be sure not to damage the skin around it.  If you have a wound, look at it several times a day to make sure it is healing.  Do not use heating pads or hot water bottles. They may burn your skin. If you have lost feeling in your feet or legs, you may not know it is happening until it is too late.  Make sure your health care provider performs a complete foot exam at least annually or more often if you have foot problems. Report any cuts, sores, or bruises to your health care provider immediately. SEEK MEDICAL CARE IF:   You have an injury that is not healing.  You have cuts or breaks in the skin.  You have an ingrown nail.  You notice redness on your legs or feet.  You feel burning or tingling in your legs or feet.  You have pain or cramps in your legs and feet.  Your legs or feet are numb.  Your feet always feel cold. SEEK IMMEDIATE MEDICAL CARE IF:   There is increasing redness,   swelling, or pain in or around a wound.  There is a red line that goes up your leg.  Pus is coming from a wound.  You develop a fever or as directed by your health care provider.  You notice a bad smell coming from an ulcer or wound. Document Released: 07/22/2000 Document Revised: 03/27/2013 Document Reviewed: 01/01/2013 Exit[employer] Patient Information 2014 [employer].  

## 2013-11-27 NOTE — Progress Notes (Signed)
   Subjective:    Patient ID: [name], male    DOB: [birth year], 79 y.o.   MRN: [id number]  HPI just here to get my toenails trimmed up    Review of Systems no new systemic changes or findings     Objective:   Physical Exam Okay objective findings as follows vascular status is intact with DP +2/4 bilateral PT plus one over 4 bilateral capillary refill time 4 seconds epicritic and proprioceptive sensations intact and symmetric to decreased on Semmes Weinstein testing to forefoot and digits bilateral there is normal plantar response DTRs not listed skin temperature warm no edema noted mild varicosities noted epicritic and proprioceptive sensations in decreased no secondary infections no open wounds ulcerations no significant keratoses noted some hyperpigmentation run the ankles posse venous stasis and edema noted nails thick brittle crumbly friable gratified 1 through 5 bilateral following debridement the hallux bilateral treated with lumicain and Neosporin Band-Aid to the left great toe is applied       Assessment & Plan:  Assessment this time his diabetes with peripheral neuropathy and complications this time painful thick deformed mycotic nails with discoloration and friability and brittleness debrided 1 through 5 bilateral return for future palliative care is needed again Neosporin and Band-Aid applied to the left great toe  Harriet Masson DPM

## 2013-12-03 DIAGNOSIS — R269 Unspecified abnormalities of gait and mobility: Secondary | ICD-10-CM | POA: Diagnosis not present

## 2013-12-03 DIAGNOSIS — M5137 Other intervertebral disc degeneration, lumbosacral region: Secondary | ICD-10-CM | POA: Diagnosis not present

## 2013-12-03 DIAGNOSIS — M6281 Muscle weakness (generalized): Secondary | ICD-10-CM | POA: Diagnosis not present

## 2013-12-05 DIAGNOSIS — R269 Unspecified abnormalities of gait and mobility: Secondary | ICD-10-CM | POA: Diagnosis not present

## 2013-12-05 DIAGNOSIS — M6281 Muscle weakness (generalized): Secondary | ICD-10-CM | POA: Diagnosis not present

## 2013-12-05 DIAGNOSIS — M5137 Other intervertebral disc degeneration, lumbosacral region: Secondary | ICD-10-CM | POA: Diagnosis not present

## 2013-12-13 DIAGNOSIS — R269 Unspecified abnormalities of gait and mobility: Secondary | ICD-10-CM | POA: Diagnosis not present

## 2013-12-13 DIAGNOSIS — M5137 Other intervertebral disc degeneration, lumbosacral region: Secondary | ICD-10-CM | POA: Diagnosis not present

## 2013-12-13 DIAGNOSIS — M6281 Muscle weakness (generalized): Secondary | ICD-10-CM | POA: Diagnosis not present

## 2013-12-16 DIAGNOSIS — R269 Unspecified abnormalities of gait and mobility: Secondary | ICD-10-CM | POA: Diagnosis not present

## 2013-12-16 DIAGNOSIS — M6281 Muscle weakness (generalized): Secondary | ICD-10-CM | POA: Diagnosis not present

## 2013-12-16 DIAGNOSIS — M5137 Other intervertebral disc degeneration, lumbosacral region: Secondary | ICD-10-CM | POA: Diagnosis not present

## 2013-12-17 DIAGNOSIS — M545 Low back pain, unspecified: Secondary | ICD-10-CM | POA: Diagnosis not present

## 2013-12-17 DIAGNOSIS — M76899 Other specified enthesopathies of unspecified lower limb, excluding foot: Secondary | ICD-10-CM | POA: Diagnosis not present

## 2013-12-17 DIAGNOSIS — M25559 Pain in unspecified hip: Secondary | ICD-10-CM | POA: Diagnosis not present

## 2013-12-17 DIAGNOSIS — M5137 Other intervertebral disc degeneration, lumbosacral region: Secondary | ICD-10-CM | POA: Diagnosis not present

## 2013-12-24 DIAGNOSIS — R269 Unspecified abnormalities of gait and mobility: Secondary | ICD-10-CM | POA: Diagnosis not present

## 2013-12-24 DIAGNOSIS — M5137 Other intervertebral disc degeneration, lumbosacral region: Secondary | ICD-10-CM | POA: Diagnosis not present

## 2013-12-24 DIAGNOSIS — M6281 Muscle weakness (generalized): Secondary | ICD-10-CM | POA: Diagnosis not present

## 2013-12-26 DIAGNOSIS — R269 Unspecified abnormalities of gait and mobility: Secondary | ICD-10-CM | POA: Diagnosis not present

## 2013-12-26 DIAGNOSIS — M6281 Muscle weakness (generalized): Secondary | ICD-10-CM | POA: Diagnosis not present

## 2013-12-26 DIAGNOSIS — M5137 Other intervertebral disc degeneration, lumbosacral region: Secondary | ICD-10-CM | POA: Diagnosis not present

## 2013-12-31 DIAGNOSIS — R269 Unspecified abnormalities of gait and mobility: Secondary | ICD-10-CM | POA: Diagnosis not present

## 2013-12-31 DIAGNOSIS — M6281 Muscle weakness (generalized): Secondary | ICD-10-CM | POA: Diagnosis not present

## 2013-12-31 DIAGNOSIS — M5137 Other intervertebral disc degeneration, lumbosacral region: Secondary | ICD-10-CM | POA: Diagnosis not present

## 2014-01-02 DIAGNOSIS — R269 Unspecified abnormalities of gait and mobility: Secondary | ICD-10-CM | POA: Diagnosis not present

## 2014-01-02 DIAGNOSIS — M6281 Muscle weakness (generalized): Secondary | ICD-10-CM | POA: Diagnosis not present

## 2014-01-02 DIAGNOSIS — M5137 Other intervertebral disc degeneration, lumbosacral region: Secondary | ICD-10-CM | POA: Diagnosis not present

## 2014-01-07 DIAGNOSIS — M5137 Other intervertebral disc degeneration, lumbosacral region: Secondary | ICD-10-CM | POA: Diagnosis not present

## 2014-01-07 DIAGNOSIS — M6281 Muscle weakness (generalized): Secondary | ICD-10-CM | POA: Diagnosis not present

## 2014-01-07 DIAGNOSIS — R269 Unspecified abnormalities of gait and mobility: Secondary | ICD-10-CM | POA: Diagnosis not present

## 2014-01-09 DIAGNOSIS — M5137 Other intervertebral disc degeneration, lumbosacral region: Secondary | ICD-10-CM | POA: Diagnosis not present

## 2014-01-09 DIAGNOSIS — R269 Unspecified abnormalities of gait and mobility: Secondary | ICD-10-CM | POA: Diagnosis not present

## 2014-01-09 DIAGNOSIS — M6281 Muscle weakness (generalized): Secondary | ICD-10-CM | POA: Diagnosis not present

## 2014-01-14 DIAGNOSIS — M6281 Muscle weakness (generalized): Secondary | ICD-10-CM | POA: Diagnosis not present

## 2014-01-14 DIAGNOSIS — R269 Unspecified abnormalities of gait and mobility: Secondary | ICD-10-CM | POA: Diagnosis not present

## 2014-01-14 DIAGNOSIS — M5137 Other intervertebral disc degeneration, lumbosacral region: Secondary | ICD-10-CM | POA: Diagnosis not present

## 2014-01-16 DIAGNOSIS — C61 Malignant neoplasm of prostate: Secondary | ICD-10-CM | POA: Diagnosis not present

## 2014-01-17 DIAGNOSIS — M5137 Other intervertebral disc degeneration, lumbosacral region: Secondary | ICD-10-CM | POA: Diagnosis not present

## 2014-01-17 DIAGNOSIS — M6281 Muscle weakness (generalized): Secondary | ICD-10-CM | POA: Diagnosis not present

## 2014-01-17 DIAGNOSIS — M5430 Sciatica, unspecified side: Secondary | ICD-10-CM | POA: Diagnosis not present

## 2014-01-17 DIAGNOSIS — M76899 Other specified enthesopathies of unspecified lower limb, excluding foot: Secondary | ICD-10-CM | POA: Diagnosis not present

## 2014-01-22 DIAGNOSIS — E785 Hyperlipidemia, unspecified: Secondary | ICD-10-CM | POA: Diagnosis not present

## 2014-01-22 DIAGNOSIS — E1149 Type 2 diabetes mellitus with other diabetic neurological complication: Secondary | ICD-10-CM | POA: Diagnosis not present

## 2014-01-22 DIAGNOSIS — I10 Essential (primary) hypertension: Secondary | ICD-10-CM | POA: Diagnosis not present

## 2014-01-22 DIAGNOSIS — C61 Malignant neoplasm of prostate: Secondary | ICD-10-CM | POA: Diagnosis not present

## 2014-01-27 DIAGNOSIS — E785 Hyperlipidemia, unspecified: Secondary | ICD-10-CM | POA: Diagnosis not present

## 2014-01-27 DIAGNOSIS — E1149 Type 2 diabetes mellitus with other diabetic neurological complication: Secondary | ICD-10-CM | POA: Diagnosis not present

## 2014-01-27 DIAGNOSIS — Z6839 Body mass index (BMI) 39.0-39.9, adult: Secondary | ICD-10-CM | POA: Diagnosis not present

## 2014-01-27 DIAGNOSIS — E119 Type 2 diabetes mellitus without complications: Secondary | ICD-10-CM | POA: Diagnosis not present

## 2014-01-27 DIAGNOSIS — I10 Essential (primary) hypertension: Secondary | ICD-10-CM | POA: Diagnosis not present

## 2014-02-05 DIAGNOSIS — E1149 Type 2 diabetes mellitus with other diabetic neurological complication: Secondary | ICD-10-CM | POA: Diagnosis not present

## 2014-02-05 DIAGNOSIS — R809 Proteinuria, unspecified: Secondary | ICD-10-CM | POA: Diagnosis not present

## 2014-02-06 DIAGNOSIS — N200 Calculus of kidney: Secondary | ICD-10-CM | POA: Diagnosis not present

## 2014-02-06 DIAGNOSIS — C61 Malignant neoplasm of prostate: Secondary | ICD-10-CM | POA: Diagnosis not present

## 2014-02-26 ENCOUNTER — Ambulatory Visit: Payer: Medicare Other

## 2014-03-05 ENCOUNTER — Ambulatory Visit (INDEPENDENT_AMBULATORY_CARE_PROVIDER_SITE_OTHER): Payer: Medicare Other

## 2014-03-05 VITALS — BP 172/81 | HR 66 | Resp 18

## 2014-03-05 DIAGNOSIS — E1149 Type 2 diabetes mellitus with other diabetic neurological complication: Secondary | ICD-10-CM

## 2014-03-05 DIAGNOSIS — Z6838 Body mass index (BMI) 38.0-38.9, adult: Secondary | ICD-10-CM | POA: Diagnosis not present

## 2014-03-05 DIAGNOSIS — L608 Other nail disorders: Secondary | ICD-10-CM

## 2014-03-05 DIAGNOSIS — M5137 Other intervertebral disc degeneration, lumbosacral region: Secondary | ICD-10-CM | POA: Diagnosis not present

## 2014-03-05 DIAGNOSIS — E1140 Type 2 diabetes mellitus with diabetic neuropathy, unspecified: Secondary | ICD-10-CM

## 2014-03-05 NOTE — Patient Instructions (Signed)
Diabetes and Foot Care Diabetes may cause you to have problems because of poor blood supply (circulation) to your feet and legs. This may cause the skin on your feet to become thinner, break easier, and heal more slowly. Your skin may become dry, and the skin may peel and crack. You may also have nerve damage in your legs and feet causing decreased feeling in them. You may not notice minor injuries to your feet that could lead to infections or more serious problems. Taking care of your feet is one of the most important things you can do for yourself.  HOME CARE INSTRUCTIONS  Wear shoes at all times, even in the house. Do not go barefoot. Bare feet are easily injured.  Check your feet daily for blisters, cuts, and redness. If you cannot see the bottom of your feet, use a mirror or ask someone for help.  Wash your feet with warm water (do not use hot water) and mild soap. Then pat your feet and the areas between your toes until they are completely dry. Do not soak your feet as this can dry your skin.  Apply a moisturizing lotion or petroleum jelly (that does not contain alcohol and is unscented) to the skin on your feet and to dry, brittle toenails. Do not apply lotion between your toes.  Trim your toenails straight across. Do not dig under them or around the cuticle. File the edges of your nails with an emery board or nail file.  Do not cut corns or calluses or try to remove them with medicine.  Wear clean socks or stockings every day. Make sure they are not too tight. Do not wear knee-high stockings since they may decrease blood flow to your legs.  Wear shoes that fit properly and have enough cushioning. To break in new shoes, wear them for just a few hours a day. This prevents you from injuring your feet. Always look in your shoes before you put them on to be sure there are no objects inside.  Do not cross your legs. This may decrease the blood flow to your feet.  If you find a minor scrape,  cut, or break in the skin on your feet, keep it and the skin around it clean and dry. These areas may be cleansed with mild soap and water. Do not cleanse the area with peroxide, alcohol, or iodine.  When you remove an adhesive bandage, be sure not to damage the skin around it.  If you have a wound, look at it several times a day to make sure it is healing.  Do not use heating pads or hot water bottles. They may burn your skin. If you have lost feeling in your feet or legs, you may not know it is happening until it is too late.  Make sure your health care provider performs a complete foot exam at least annually or more often if you have foot problems. Report any cuts, sores, or bruises to your health care provider immediately. SEEK MEDICAL CARE IF:   You have an injury that is not healing.  You have cuts or breaks in the skin.  You have an ingrown nail.  You notice redness on your legs or feet.  You feel burning or tingling in your legs or feet.  You have pain or cramps in your legs and feet.  Your legs or feet are numb.  Your feet always feel cold. SEEK IMMEDIATE MEDICAL CARE IF:   There is increasing redness,   swelling, or pain in or around a wound.  There is a red line that goes up your leg.  Pus is coming from a wound.  You develop a fever or as directed by your health care provider.  You notice a bad smell coming from an ulcer or wound. Document Released: 07/22/2000 Document Revised: 03/27/2013 Document Reviewed: 01/01/2013 Exit[employer] Patient Information 2015 [employer]. This information is not intended to replace advice given to you by your health care provider. Make sure you discuss any questions you have with your health care provider.  

## 2014-03-05 NOTE — Progress Notes (Signed)
   Subjective:    Patient ID: [name], male    DOB: January 24, 1935, 79 y.o.   MRN: [id number]  HPI I AM HERE TO GET MY TOENAILS TRIMMED    Review of Systems no new findings or systemic changes noted    Objective:   Physical Exam Lortab objective findings as follows vascular status is intact DP postal for PT plus one over 4 bilateral capillary refill time 4 seconds. Epicritic sensations diminished on Semmes Weinstein testing to forefoot and digits. There is normal plantar response DTRs not elicited. Temperature warm no edema noted mild varicosities still present there is no significant keratoses no open wounds or ulcerations or nails. Thick hypertrophic brittle and friable 1 through 5 bilateral tender on palpation and debridement at this time       Assessment & Plan:  Assessment this time diabetes with history peripheral neuropathy and angiopathy thick brittle crumbly friable mycotic nails 1 through 5 bilateral are debrided at this time and the presence of pain in symptomology as well as diabetes and complications. On debridement the hallux bilateral fourth digit right and third digit left are treated with lumicain and Neosporin which immediately stop any minimal bleeding patient last followup in the future as needed maintain good walking shoes and socks at all times reappointed 3 months  Harriet Masson DPM

## 2014-03-12 DIAGNOSIS — M519 Unspecified thoracic, thoracolumbar and lumbosacral intervertebral disc disorder: Secondary | ICD-10-CM | POA: Diagnosis not present

## 2014-03-12 DIAGNOSIS — M5137 Other intervertebral disc degeneration, lumbosacral region: Secondary | ICD-10-CM | POA: Diagnosis not present

## 2014-03-24 DIAGNOSIS — M5137 Other intervertebral disc degeneration, lumbosacral region: Secondary | ICD-10-CM | POA: Diagnosis not present

## 2014-03-24 DIAGNOSIS — M545 Low back pain, unspecified: Secondary | ICD-10-CM | POA: Diagnosis not present

## 2014-04-07 DIAGNOSIS — M48061 Spinal stenosis, lumbar region without neurogenic claudication: Secondary | ICD-10-CM | POA: Diagnosis not present

## 2014-04-07 DIAGNOSIS — Z6838 Body mass index (BMI) 38.0-38.9, adult: Secondary | ICD-10-CM | POA: Diagnosis not present

## 2014-04-15 DIAGNOSIS — M545 Low back pain, unspecified: Secondary | ICD-10-CM | POA: Diagnosis not present

## 2014-04-15 DIAGNOSIS — M47817 Spondylosis without myelopathy or radiculopathy, lumbosacral region: Secondary | ICD-10-CM | POA: Diagnosis not present

## 2014-05-07 DIAGNOSIS — E1149 Type 2 diabetes mellitus with other diabetic neurological complication: Secondary | ICD-10-CM | POA: Diagnosis not present

## 2014-05-07 DIAGNOSIS — E785 Hyperlipidemia, unspecified: Secondary | ICD-10-CM | POA: Diagnosis not present

## 2014-05-07 DIAGNOSIS — D649 Anemia, unspecified: Secondary | ICD-10-CM | POA: Diagnosis not present

## 2014-05-09 DIAGNOSIS — I10 Essential (primary) hypertension: Secondary | ICD-10-CM | POA: Diagnosis not present

## 2014-05-09 DIAGNOSIS — Z23 Encounter for immunization: Secondary | ICD-10-CM | POA: Diagnosis not present

## 2014-05-09 DIAGNOSIS — E1140 Type 2 diabetes mellitus with diabetic neuropathy, unspecified: Secondary | ICD-10-CM | POA: Diagnosis not present

## 2014-05-09 DIAGNOSIS — E785 Hyperlipidemia, unspecified: Secondary | ICD-10-CM | POA: Diagnosis not present

## 2014-05-09 DIAGNOSIS — Z6838 Body mass index (BMI) 38.0-38.9, adult: Secondary | ICD-10-CM | POA: Diagnosis not present

## 2014-05-26 DIAGNOSIS — M25551 Pain in right hip: Secondary | ICD-10-CM | POA: Diagnosis not present

## 2014-05-26 DIAGNOSIS — M545 Low back pain: Secondary | ICD-10-CM | POA: Diagnosis not present

## 2014-05-26 DIAGNOSIS — M47817 Spondylosis without myelopathy or radiculopathy, lumbosacral region: Secondary | ICD-10-CM | POA: Diagnosis not present

## 2014-05-26 DIAGNOSIS — M5137 Other intervertebral disc degeneration, lumbosacral region: Secondary | ICD-10-CM | POA: Diagnosis not present

## 2014-06-02 ENCOUNTER — Ambulatory Visit (INDEPENDENT_AMBULATORY_CARE_PROVIDER_SITE_OTHER): Payer: Medicare Other

## 2014-06-02 DIAGNOSIS — E1140 Type 2 diabetes mellitus with diabetic neuropathy, unspecified: Secondary | ICD-10-CM

## 2014-06-02 DIAGNOSIS — L603 Nail dystrophy: Secondary | ICD-10-CM

## 2014-06-02 NOTE — Progress Notes (Signed)
   Subjective:    Patient ID: [name], male    DOB: 02/15/1935, 79 y.o.   MRN: [id number]  HPI  TOENAILS TRIM.  Review of Systems no new findings or systemic changes noted     Objective:   Physical Exam  79 year old white male well-developed well-nourished oriented 3 presents this time for diabetic foot and nail care his pedal pulses intact DP +2 PT +1 over 4 bilateral Refill time 3 seconds to 4 seconds all digits. Decreased sensation confirmed N Sims Weinstein to the forefoot and digits and arch. There is normal plantar response and DTRs not elicited. Dermatologic skin color pigment normal hair growth absent nails thick brittle crumbly dystrophic friable and discolored through 5 bilateral nails are debrided in the presence of pain symptomology as well as diabetes and complications       Assessment & Plan:   assessment diabetes history of peripheral neuropathy thick brittle dystrophic mycotic nails painful tenderness symptomatic with discoloration dystrophy and friability debrided 1 through 5 bilateral also with lidocaine and Neosporin applied to the right hallux and third toe left foot following debridement return for future palliative care in 3 months or on an as-needed basis   Harriet Masson DPM

## 2014-06-02 NOTE — Patient Instructions (Signed)
Diabetes and Foot Care Diabetes may cause you to have problems because of poor blood supply (circulation) to your feet and legs. This may cause the skin on your feet to become thinner, break easier, and heal more slowly. Your skin may become dry, and the skin may peel and crack. You may also have nerve damage in your legs and feet causing decreased feeling in them. You may not notice minor injuries to your feet that could lead to infections or more serious problems. Taking care of your feet is one of the most important things you can do for yourself.  HOME CARE INSTRUCTIONS  Wear shoes at all times, even in the house. Do not go barefoot. Bare feet are easily injured.  Check your feet daily for blisters, cuts, and redness. If you cannot see the bottom of your feet, use a mirror or ask someone for help.  Wash your feet with warm water (do not use hot water) and mild soap. Then pat your feet and the areas between your toes until they are completely dry. Do not soak your feet as this can dry your skin.  Apply a moisturizing lotion or petroleum jelly (that does not contain alcohol and is unscented) to the skin on your feet and to dry, brittle toenails. Do not apply lotion between your toes.  Trim your toenails straight across. Do not dig under them or around the cuticle. File the edges of your nails with an emery board or nail file.  Do not cut corns or calluses or try to remove them with medicine.  Wear clean socks or stockings every day. Make sure they are not too tight. Do not wear knee-high stockings since they may decrease blood flow to your legs.  Wear shoes that fit properly and have enough cushioning. To break in new shoes, wear them for just a few hours a day. This prevents you from injuring your feet. Always look in your shoes before you put them on to be sure there are no objects inside.  Do not cross your legs. This may decrease the blood flow to your feet.  If you find a minor scrape,  cut, or break in the skin on your feet, keep it and the skin around it clean and dry. These areas may be cleansed with mild soap and water. Do not cleanse the area with peroxide, alcohol, or iodine.  When you remove an adhesive bandage, be sure not to damage the skin around it.  If you have a wound, look at it several times a day to make sure it is healing.  Do not use heating pads or hot water bottles. They may burn your skin. If you have lost feeling in your feet or legs, you may not know it is happening until it is too late.  Make sure your health care provider performs a complete foot exam at least annually or more often if you have foot problems. Report any cuts, sores, or bruises to your health care provider immediately. SEEK MEDICAL CARE IF:   You have an injury that is not healing.  You have cuts or breaks in the skin.  You have an ingrown nail.  You notice redness on your legs or feet.  You feel burning or tingling in your legs or feet.  You have pain or cramps in your legs and feet.  Your legs or feet are numb.  Your feet always feel cold. SEEK IMMEDIATE MEDICAL CARE IF:   There is increasing redness,   swelling, or pain in or around a wound.  There is a red line that goes up your leg.  Pus is coming from a wound.  You develop a fever or as directed by your health care provider.  You notice a bad smell coming from an ulcer or wound. Document Released: 07/22/2000 Document Revised: 03/27/2013 Document Reviewed: 01/01/2013 Exit[employer] Patient Information 2015 [employer]. This information is not intended to replace advice given to you by your health care provider. Make sure you discuss any questions you have with your health care provider.  

## 2014-06-04 ENCOUNTER — Ambulatory Visit: Payer: Medicare Other

## 2014-07-01 DIAGNOSIS — E119 Type 2 diabetes mellitus without complications: Secondary | ICD-10-CM | POA: Diagnosis not present

## 2014-07-01 DIAGNOSIS — H52223 Regular astigmatism, bilateral: Secondary | ICD-10-CM | POA: Diagnosis not present

## 2014-07-01 DIAGNOSIS — H5203 Hypermetropia, bilateral: Secondary | ICD-10-CM | POA: Diagnosis not present

## 2014-07-01 DIAGNOSIS — I10 Essential (primary) hypertension: Secondary | ICD-10-CM | POA: Diagnosis not present

## 2014-07-01 DIAGNOSIS — H4011X3 Primary open-angle glaucoma, severe stage: Secondary | ICD-10-CM | POA: Diagnosis not present

## 2014-07-01 DIAGNOSIS — E11319 Type 2 diabetes mellitus with unspecified diabetic retinopathy without macular edema: Secondary | ICD-10-CM | POA: Diagnosis not present

## 2014-07-01 DIAGNOSIS — E11329 Type 2 diabetes mellitus with mild nonproliferative diabetic retinopathy without macular edema: Secondary | ICD-10-CM | POA: Diagnosis not present

## 2014-07-01 DIAGNOSIS — H524 Presbyopia: Secondary | ICD-10-CM | POA: Diagnosis not present

## 2014-07-30 DIAGNOSIS — Z6837 Body mass index (BMI) 37.0-37.9, adult: Secondary | ICD-10-CM | POA: Diagnosis not present

## 2014-07-30 DIAGNOSIS — M47817 Spondylosis without myelopathy or radiculopathy, lumbosacral region: Secondary | ICD-10-CM | POA: Diagnosis not present

## 2014-07-30 DIAGNOSIS — M5136 Other intervertebral disc degeneration, lumbar region: Secondary | ICD-10-CM | POA: Diagnosis not present

## 2014-07-30 DIAGNOSIS — M4806 Spinal stenosis, lumbar region: Secondary | ICD-10-CM | POA: Diagnosis not present

## 2014-08-13 DIAGNOSIS — E785 Hyperlipidemia, unspecified: Secondary | ICD-10-CM | POA: Diagnosis not present

## 2014-08-13 DIAGNOSIS — I10 Essential (primary) hypertension: Secondary | ICD-10-CM | POA: Diagnosis not present

## 2014-08-13 DIAGNOSIS — E1140 Type 2 diabetes mellitus with diabetic neuropathy, unspecified: Secondary | ICD-10-CM | POA: Diagnosis not present

## 2014-08-13 DIAGNOSIS — D649 Anemia, unspecified: Secondary | ICD-10-CM | POA: Diagnosis not present

## 2014-08-15 DIAGNOSIS — I10 Essential (primary) hypertension: Secondary | ICD-10-CM | POA: Diagnosis not present

## 2014-08-15 DIAGNOSIS — E1140 Type 2 diabetes mellitus with diabetic neuropathy, unspecified: Secondary | ICD-10-CM | POA: Diagnosis not present

## 2014-08-15 DIAGNOSIS — Z1389 Encounter for screening for other disorder: Secondary | ICD-10-CM | POA: Diagnosis not present

## 2014-08-15 DIAGNOSIS — Z9181 History of falling: Secondary | ICD-10-CM | POA: Diagnosis not present

## 2014-08-15 DIAGNOSIS — D649 Anemia, unspecified: Secondary | ICD-10-CM | POA: Diagnosis not present

## 2014-08-15 DIAGNOSIS — E785 Hyperlipidemia, unspecified: Secondary | ICD-10-CM | POA: Diagnosis not present

## 2014-09-01 ENCOUNTER — Other Ambulatory Visit: Payer: Self-pay | Admitting: Neurological Surgery

## 2014-09-01 ENCOUNTER — Ambulatory Visit: Payer: Medicare Other

## 2014-09-01 DIAGNOSIS — M4806 Spinal stenosis, lumbar region: Secondary | ICD-10-CM | POA: Diagnosis not present

## 2014-09-01 DIAGNOSIS — I10 Essential (primary) hypertension: Secondary | ICD-10-CM | POA: Diagnosis not present

## 2014-09-01 DIAGNOSIS — Z6837 Body mass index (BMI) 37.0-37.9, adult: Secondary | ICD-10-CM | POA: Diagnosis not present

## 2014-09-08 ENCOUNTER — Encounter (HOSPITAL_COMMUNITY): Payer: Self-pay

## 2014-09-08 ENCOUNTER — Ambulatory Visit (HOSPITAL_COMMUNITY)
Admission: RE | Admit: 2014-09-08 | Discharge: 2014-09-08 | Disposition: A | Discharge status: Home / Self Care | Payer: Medicare Other | Source: Ambulatory Visit | Attending: Neurological Surgery | Admitting: Neurological Surgery

## 2014-09-08 ENCOUNTER — Encounter (HOSPITAL_COMMUNITY)
Admission: RE | Admit: 2014-09-08 | Discharge: 2014-09-08 | Disposition: A | Discharge status: Home / Self Care | Payer: Medicare Other | Source: Ambulatory Visit | Attending: Neurological Surgery | Admitting: Neurological Surgery

## 2014-09-08 DIAGNOSIS — Z8546 Personal history of malignant neoplasm of prostate: Secondary | ICD-10-CM | POA: Insufficient documentation

## 2014-09-08 DIAGNOSIS — Z87891 Personal history of nicotine dependence: Secondary | ICD-10-CM | POA: Diagnosis not present

## 2014-09-08 DIAGNOSIS — E1140 Type 2 diabetes mellitus with diabetic neuropathy, unspecified: Secondary | ICD-10-CM | POA: Diagnosis not present

## 2014-09-08 DIAGNOSIS — I10 Essential (primary) hypertension: Secondary | ICD-10-CM | POA: Insufficient documentation

## 2014-09-08 DIAGNOSIS — Z01812 Encounter for preprocedural laboratory examination: Secondary | ICD-10-CM | POA: Diagnosis not present

## 2014-09-08 DIAGNOSIS — J449 Chronic obstructive pulmonary disease, unspecified: Secondary | ICD-10-CM | POA: Diagnosis not present

## 2014-09-08 DIAGNOSIS — K219 Gastro-esophageal reflux disease without esophagitis: Secondary | ICD-10-CM | POA: Insufficient documentation

## 2014-09-08 DIAGNOSIS — F419 Anxiety disorder, unspecified: Secondary | ICD-10-CM | POA: Diagnosis not present

## 2014-09-08 DIAGNOSIS — Z01818 Encounter for other preprocedural examination: Secondary | ICD-10-CM | POA: Diagnosis not present

## 2014-09-08 DIAGNOSIS — M4806 Spinal stenosis, lumbar region: Secondary | ICD-10-CM | POA: Insufficient documentation

## 2014-09-08 DIAGNOSIS — F329 Major depressive disorder, single episode, unspecified: Secondary | ICD-10-CM | POA: Insufficient documentation

## 2014-09-08 DIAGNOSIS — E780 Pure hypercholesterolemia: Secondary | ICD-10-CM | POA: Diagnosis not present

## 2014-09-08 DIAGNOSIS — M48061 Spinal stenosis, lumbar region without neurogenic claudication: Secondary | ICD-10-CM

## 2014-09-08 DIAGNOSIS — R001 Bradycardia, unspecified: Secondary | ICD-10-CM | POA: Diagnosis not present

## 2014-09-08 DIAGNOSIS — B488 Other specified mycoses: Secondary | ICD-10-CM | POA: Diagnosis not present

## 2014-09-08 DIAGNOSIS — Z794 Long term (current) use of insulin: Secondary | ICD-10-CM | POA: Insufficient documentation

## 2014-09-08 DIAGNOSIS — Z01810 Encounter for preprocedural cardiovascular examination: Secondary | ICD-10-CM | POA: Diagnosis not present

## 2014-09-08 HISTORY — DX: Bronchitis, not specified as acute or chronic: J40

## 2014-09-08 HISTORY — DX: Depression, unspecified: F32.A

## 2014-09-08 HISTORY — DX: Anxiety disorder, unspecified: F41.9

## 2014-09-08 HISTORY — DX: Gastro-esophageal reflux disease without esophagitis: K21.9

## 2014-09-08 HISTORY — DX: Major depressive disorder, single episode, unspecified: F32.9

## 2014-09-08 LAB — CBC WITH DIFFERENTIAL/PLATELET
Basophils Absolute: 0.1 10*3/uL (ref 0.0–0.1)
Basophils Relative: 2 % — ABNORMAL HIGH (ref 0–1)
Eosinophils Absolute: 0.2 10*3/uL (ref 0.0–0.7)
Eosinophils Relative: 3 % (ref 0–5)
HCT: 35.6 % — ABNORMAL LOW (ref 39.0–52.0)
Hemoglobin: 12.4 g/dL — ABNORMAL LOW (ref 13.0–17.0)
Lymphocytes Relative: 24 % (ref 12–46)
Lymphs Abs: 1.7 10*3/uL (ref 0.7–4.0)
MCH: 31.5 pg (ref 26.0–34.0)
MCHC: 34.8 g/dL (ref 30.0–36.0)
MCV: 90.4 fL (ref 78.0–100.0)
Monocytes Absolute: 0.4 10*3/uL (ref 0.1–1.0)
Monocytes Relative: 6 % (ref 3–12)
Neutro Abs: 4.5 10*3/uL (ref 1.7–7.7)
Neutrophils Relative %: 65 % (ref 43–77)
Platelets: 133 10*3/uL — ABNORMAL LOW (ref 150–400)
RBC: 3.94 MIL/uL — ABNORMAL LOW (ref 4.22–5.81)
RDW: 12.5 % (ref 11.5–15.5)
WBC: 6.8 10*3/uL (ref 4.0–10.5)

## 2014-09-08 LAB — BASIC METABOLIC PANEL
Anion gap: 8 (ref 5–15)
BUN: 27 mg/dL — ABNORMAL HIGH (ref 6–23)
CO2: 23 mmol/L (ref 19–32)
Calcium: 8.7 mg/dL (ref 8.4–10.5)
Chloride: 106 mmol/L (ref 96–112)
Creatinine, Ser: 1.38 mg/dL — ABNORMAL HIGH (ref 0.50–1.35)
GFR calc Af Amer: 55 mL/min — ABNORMAL LOW (ref >90)
GFR calc non Af Amer: 48 mL/min — ABNORMAL LOW (ref >90)
Glucose, Bld: 134 mg/dL — ABNORMAL HIGH (ref 70–99)
Potassium: 4.1 mmol/L (ref 3.5–5.1)
Sodium: 137 mmol/L (ref 135–145)

## 2014-09-08 LAB — PROTIME-INR
INR: 1.08 (ref 0.00–1.49)
Prothrombin Time: 14.1 seconds (ref 11.6–15.2)

## 2014-09-08 LAB — SURGICAL PCR SCREEN
MRSA, PCR: NEGATIVE
Staphylococcus aureus: NEGATIVE

## 2014-09-08 LAB — GLUCOSE, CAPILLARY: Glucose-Capillary: 137 mg/dL — ABNORMAL HIGH (ref 70–99)

## 2014-09-08 NOTE — Pre-Procedure Instructions (Signed)
[name]  09/08/2014   Your procedure is scheduled on:  Friday September 12, 2014  Report to [facility] Admitting at 0730 AM.  Call this number if you have problems the morning of surgery: [phone]   Remember:   Do not eat food or drink liquids after midnight Thursday 09/11/14.   Take these medicines the morning of surgery with A SIP OF WATER: Amlodipine, Gabapentin, andOmeprazole    Do not wear jewelry.  Do not wear lotions, powders, or colognes.. You may not wear deodorant.   Men may shave face and neck.  Do not bring valuables to the hospital.  [facility] is not responsible                  for any belongings or valuables.               Contacts, dentures or bridgework may not be worn into surgery.  Leave suitcase in the car. After surgery it may be brought to your room.  For patients admitted to the hospital, discharge time is determined by your                treatment team.               Patients discharged the day of surgery will not be allowed to drive  home.    Special Instructions: [facility] - Preparing for Surgery  Before surgery, you can play an important role.  Because skin is not sterile, your skin needs to be as free of germs as possible.  You can reduce the number of germs on you skin by washing with CHG (chlorahexidine gluconate) soap before surgery.  CHG is an antiseptic cleaner which kills germs and bonds with the skin to continue killing germs even after washing.  Please DO NOT use if you have an allergy to CHG or antibacterial soaps.  If your skin becomes reddened/irritated stop using the CHG and inform your nurse when you arrive at Short Stay.  Do not shave (including legs and underarms) for at least 48 hours prior to the first CHG shower.  You may shave your face.  Please follow these instructions carefully:   1.  Shower with CHG Soap the night before surgery and the                                morning of Surgery.  2.  If you choose to  wash your hair, wash your hair first as usual with your       normal shampoo.  3.  After you shampoo, rinse your hair and body thoroughly to remove the                      Shampoo.  4.  Use CHG as you would any other liquid soap.  You can apply chg directly       to the skin and wash gently with scrungie or a clean washcloth.  5.  Apply the CHG Soap to your body ONLY FROM THE NECK DOWN.        Do not use on open wounds or open sores.  Avoid contact with your eyes,       ears, mouth and genitals (private parts).  Wash genitals (private parts)       with your normal soap.  6.  Wash thoroughly, paying special attention to the  area where your surgery        will be performed.  7.  Thoroughly rinse your body with warm water from the neck down.  8.  DO NOT shower/wash with your normal soap after using and rinsing off       the CHG Soap.  9.  Pat yourself dry with a clean towel.            10.  Wear clean pajamas.            11.  Place clean sheets on your bed the night of your first shower and do not        sleep with pets.  Day of Surgery  Do not apply any lotions/deoderants the morning of surgery.  Please wear clean clothes to the hospital/surgery center.      Please read over the following fact sheets that you were given: Pain Booklet, Coughing and Deep Breathing, MRSA Information and Surgical Site Infection Prevention

## 2014-09-09 LAB — HEMOGLOBIN A1C
Hgb A1c MFr Bld: 6.6 % — ABNORMAL HIGH (ref 4.8–5.6)
Mean Plasma Glucose: 143 mg/dL

## 2014-09-09 NOTE — Progress Notes (Addendum)
Anesthesia Chart Review:  Patient is a 79 year old male scheduled for L3-4, L4-5 laminectomy/foraminotomy on 09/12/14 by Dr. Ronnald Ramp.  History includes former smoker, prostate cancer '02 s/p external beam radiation and Lupron therapy (Dr. Rockey Situ), anxiety, depression, GERD, DM on insulin, hypercholesterolemia, nephrolithiasis. BMI is consistent with obesity.  PCP is listed as Dr. Nelda Bucks.  He has seen cardiologist Dr. Lennox Pippins in the past, records pending.  09/08/14 EKG: SB at 56 bpm, non-specific T wave abnormality.  09/08/14 CXR: Mild hyperinflation consistent with COPD. There is no active cardiopulmonary disease.  Preoperative labs noted.  BUN/Cr 27/1.38.  H/H 12.4/35.6, PLT 133K. A1C 6.6.   I'll follow-up once 2014 cardiology records are available.  [facility] Short Stay Center/Anesthesiology Phone [phone] 09/09/2014 2:39 PM  Addendum: A copy of patient's 01/24/13 nuclear stress test (done at [place]) was received from Dr. Denton Lank office. Test was ordered as part of a pre-operative evaluation prior to esophageal dilation.  Results showed: Probable diaphragm attenuation artifact, normal wall motion and thickening with EF calculated at 71%. No significant areas of reversible hypoperfusion are identified. This is a low risk myocardial perfusion stress test.  Anticipate that he can proceed as planned.  George Hugh [facility] Short Stay Center/Anesthesiology Phone [phone] 09/11/2014 9:37 AM

## 2014-09-11 MED ORDER — DEXAMETHASONE SODIUM PHOSPHATE 10 MG/ML IJ SOLN
10.0000 mg | INTRAMUSCULAR | Status: AC
  Administered 2014-09-12: 10 mg via INTRAVENOUS
  Filled 2014-09-11: qty 1

## 2014-09-11 MED ORDER — CEFAZOLIN SODIUM-DEXTROSE 2-3 GM-% IV SOLR
2.0000 g | INTRAVENOUS | Status: AC
  Administered 2014-09-12: 2 g via INTRAVENOUS
  Filled 2014-09-11: qty 50

## 2014-09-12 ENCOUNTER — Ambulatory Visit: Payer: Medicare Other

## 2014-09-12 ENCOUNTER — Inpatient Hospital Stay (HOSPITAL_COMMUNITY): Payer: Medicare Other | Admitting: Vascular Surgery

## 2014-09-12 ENCOUNTER — Encounter (HOSPITAL_COMMUNITY): Payer: Self-pay | Admitting: Neurological Surgery

## 2014-09-12 ENCOUNTER — Inpatient Hospital Stay (HOSPITAL_COMMUNITY)
Admission: RE | Admit: 2014-09-12 | Discharge: 2014-09-13 | DRG: 520 | Disposition: A | Discharge status: Home / Self Care | Payer: Medicare Other | Source: Ambulatory Visit | Attending: Neurological Surgery | Admitting: Neurological Surgery

## 2014-09-12 ENCOUNTER — Encounter (HOSPITAL_COMMUNITY)
Admission: RE | Disposition: A | Discharge status: Home / Self Care | Payer: Self-pay | Source: Ambulatory Visit | Attending: Neurological Surgery

## 2014-09-12 ENCOUNTER — Inpatient Hospital Stay (HOSPITAL_COMMUNITY): Payer: Medicare Other | Admitting: Certified Registered"

## 2014-09-12 ENCOUNTER — Inpatient Hospital Stay (HOSPITAL_COMMUNITY): Payer: Medicare Other

## 2014-09-12 DIAGNOSIS — Z87891 Personal history of nicotine dependence: Secondary | ICD-10-CM

## 2014-09-12 DIAGNOSIS — F419 Anxiety disorder, unspecified: Secondary | ICD-10-CM | POA: Diagnosis present

## 2014-09-12 DIAGNOSIS — E780 Pure hypercholesterolemia: Secondary | ICD-10-CM | POA: Diagnosis present

## 2014-09-12 DIAGNOSIS — Z87442 Personal history of urinary calculi: Secondary | ICD-10-CM

## 2014-09-12 DIAGNOSIS — I10 Essential (primary) hypertension: Secondary | ICD-10-CM | POA: Diagnosis present

## 2014-09-12 DIAGNOSIS — Z8546 Personal history of malignant neoplasm of prostate: Secondary | ICD-10-CM

## 2014-09-12 DIAGNOSIS — M4806 Spinal stenosis, lumbar region: Principal | ICD-10-CM | POA: Diagnosis present

## 2014-09-12 DIAGNOSIS — F329 Major depressive disorder, single episode, unspecified: Secondary | ICD-10-CM | POA: Diagnosis present

## 2014-09-12 DIAGNOSIS — E119 Type 2 diabetes mellitus without complications: Secondary | ICD-10-CM | POA: Diagnosis present

## 2014-09-12 DIAGNOSIS — K219 Gastro-esophageal reflux disease without esophagitis: Secondary | ICD-10-CM | POA: Diagnosis present

## 2014-09-12 DIAGNOSIS — Z9889 Other specified postprocedural states: Secondary | ICD-10-CM

## 2014-09-12 DIAGNOSIS — C61 Malignant neoplasm of prostate: Secondary | ICD-10-CM | POA: Diagnosis not present

## 2014-09-12 DIAGNOSIS — M48061 Spinal stenosis, lumbar region without neurogenic claudication: Secondary | ICD-10-CM

## 2014-09-12 HISTORY — PX: LUMBAR LAMINECTOMY/DECOMPRESSION MICRODISCECTOMY: SHX5026

## 2014-09-12 LAB — GLUCOSE, CAPILLARY
Glucose-Capillary: 141 mg/dL — ABNORMAL HIGH (ref 70–99)
Glucose-Capillary: 194 mg/dL — ABNORMAL HIGH (ref 70–99)
Glucose-Capillary: 232 mg/dL — ABNORMAL HIGH (ref 70–99)
Glucose-Capillary: 286 mg/dL — ABNORMAL HIGH (ref 70–99)

## 2014-09-12 SURGERY — LUMBAR LAMINECTOMY/DECOMPRESSION MICRODISCECTOMY 2 LEVELS
Anesthesia: General | Site: Spine Lumbar | Laterality: Bilateral

## 2014-09-12 MED ORDER — VALSARTAN-HYDROCHLOROTHIAZIDE 320-25 MG PO TABS: 1.0000 | ORAL_TABLET | Freq: Every day | ORAL | Status: DC

## 2014-09-12 MED ORDER — POTASSIUM CHLORIDE IN NACL 20-0.9 MEQ/L-% IV SOLN
INTRAVENOUS | Status: DC
  Filled 2014-09-12 (×3): qty 1000

## 2014-09-12 MED ORDER — GABAPENTIN 300 MG PO CAPS
600.0000 mg | ORAL_CAPSULE | Freq: Two times a day (BID) | ORAL | Status: DC
  Administered 2014-09-12: 600 mg via ORAL
  Filled 2014-09-12 (×3): qty 2

## 2014-09-12 MED ORDER — FENTANYL CITRATE 0.05 MG/ML IJ SOLN
INTRAMUSCULAR | Status: DC | PRN
  Administered 2014-09-12: 100 ug via INTRAVENOUS
  Administered 2014-09-12: 150 ug via INTRAVENOUS

## 2014-09-12 MED ORDER — PROPOFOL 10 MG/ML IV BOLUS
INTRAVENOUS | Status: AC
  Filled 2014-09-12: qty 20

## 2014-09-12 MED ORDER — ONDANSETRON HCL 4 MG/2ML IJ SOLN: 4.0000 mg | INTRAMUSCULAR | Status: DC | PRN

## 2014-09-12 MED ORDER — METHOCARBAMOL 1000 MG/10ML IJ SOLN
500.0000 mg | INTRAVENOUS | Status: AC
  Administered 2014-09-12: 500 mg via INTRAVENOUS
  Filled 2014-09-12: qty 5

## 2014-09-12 MED ORDER — MORPHINE SULFATE 2 MG/ML IJ SOLN
1.0000 mg | INTRAMUSCULAR | Status: DC | PRN
  Administered 2014-09-13: 2 mg via INTRAVENOUS
  Filled 2014-09-12: qty 1

## 2014-09-12 MED ORDER — MIDAZOLAM HCL 2 MG/2ML IJ SOLN
INTRAMUSCULAR | Status: AC
  Filled 2014-09-12: qty 2

## 2014-09-12 MED ORDER — EPHEDRINE SULFATE 50 MG/ML IJ SOLN
INTRAMUSCULAR | Status: AC
  Filled 2014-09-12: qty 1

## 2014-09-12 MED ORDER — THROMBIN 5000 UNITS EX SOLR
CUTANEOUS | Status: DC | PRN
  Administered 2014-09-12 (×2): 5000 [IU] via TOPICAL

## 2014-09-12 MED ORDER — ONDANSETRON HCL 4 MG/2ML IJ SOLN
INTRAMUSCULAR | Status: DC | PRN
  Administered 2014-09-12: 4 mg via INTRAVENOUS

## 2014-09-12 MED ORDER — PROMETHAZINE HCL 25 MG/ML IJ SOLN: 6.2500 mg | INTRAMUSCULAR | Status: DC | PRN

## 2014-09-12 MED ORDER — FENTANYL CITRATE 0.05 MG/ML IJ SOLN
INTRAMUSCULAR | Status: AC
  Filled 2014-09-12: qty 5

## 2014-09-12 MED ORDER — ROCURONIUM BROMIDE 100 MG/10ML IV SOLN
INTRAVENOUS | Status: DC | PRN
  Administered 2014-09-12: 50 mg via INTRAVENOUS

## 2014-09-12 MED ORDER — INSULIN GLARGINE 100 UNIT/ML ~~LOC~~ SOLN
15.0000 [IU] | Freq: Two times a day (BID) | SUBCUTANEOUS | Status: DC
  Administered 2014-09-12: 15 [IU] via SUBCUTANEOUS
  Filled 2014-09-12 (×4): qty 0.15

## 2014-09-12 MED ORDER — LACTATED RINGERS IV SOLN: INTRAVENOUS | Status: DC

## 2014-09-12 MED ORDER — LACTATED RINGERS IV SOLN
INTRAVENOUS | Status: DC
  Administered 2014-09-12: 08:00:00 via INTRAVENOUS

## 2014-09-12 MED ORDER — PROPOFOL 10 MG/ML IV BOLUS
INTRAVENOUS | Status: DC | PRN
  Administered 2014-09-12: 150 mg via INTRAVENOUS
  Administered 2014-09-12: 40 mg via INTRAVENOUS

## 2014-09-12 MED ORDER — EPHEDRINE SULFATE 50 MG/ML IJ SOLN
INTRAMUSCULAR | Status: DC | PRN
  Administered 2014-09-12: 5 mg via INTRAVENOUS
  Administered 2014-09-12: 10 mg via INTRAVENOUS
  Administered 2014-09-12: 5 mg via INTRAVENOUS

## 2014-09-12 MED ORDER — BUPIVACAINE HCL (PF) 0.25 % IJ SOLN
INTRAMUSCULAR | Status: DC | PRN
  Administered 2014-09-12: 7 mL

## 2014-09-12 MED ORDER — HYDROMORPHONE HCL 1 MG/ML IJ SOLN
INTRAMUSCULAR | Status: AC
  Filled 2014-09-12: qty 1

## 2014-09-12 MED ORDER — THROMBIN 5000 UNITS EX SOLR
OROMUCOSAL | Status: DC | PRN
  Administered 2014-09-12: 11:00:00 via TOPICAL

## 2014-09-12 MED ORDER — SODIUM CHLORIDE 0.9 % IR SOLN
Status: DC | PRN
  Administered 2014-09-12: 11:00:00

## 2014-09-12 MED ORDER — ONDANSETRON HCL 4 MG/2ML IJ SOLN
INTRAMUSCULAR | Status: AC
  Filled 2014-09-12: qty 2

## 2014-09-12 MED ORDER — SODIUM CHLORIDE 0.9 % IJ SOLN: 3.0000 mL | INTRAMUSCULAR | Status: DC | PRN

## 2014-09-12 MED ORDER — CITALOPRAM HYDROBROMIDE 40 MG PO TABS
40.0000 mg | ORAL_TABLET | Freq: Every day | ORAL | Status: DC
  Filled 2014-09-12: qty 1

## 2014-09-12 MED ORDER — PHENOL 1.4 % MT LIQD: 1.0000 | OROMUCOSAL | Status: DC | PRN

## 2014-09-12 MED ORDER — SODIUM CHLORIDE 0.9 % IV SOLN: 250.0000 mL | INTRAVENOUS | Status: DC

## 2014-09-12 MED ORDER — MIDAZOLAM HCL 2 MG/2ML IJ SOLN
0.5000 mg | Freq: Once | INTRAMUSCULAR | Status: AC | PRN
  Administered 2014-09-12: 2 mg via INTRAVENOUS

## 2014-09-12 MED ORDER — ACETAMINOPHEN 325 MG PO TABS: 650.0000 mg | ORAL_TABLET | ORAL | Status: DC | PRN

## 2014-09-12 MED ORDER — IRBESARTAN 300 MG PO TABS
300.0000 mg | ORAL_TABLET | Freq: Every day | ORAL | Status: DC
  Filled 2014-09-12: qty 1

## 2014-09-12 MED ORDER — ACETAMINOPHEN 650 MG RE SUPP: 650.0000 mg | RECTAL | Status: DC | PRN

## 2014-09-12 MED ORDER — ROCURONIUM BROMIDE 50 MG/5ML IV SOLN
INTRAVENOUS | Status: AC
  Filled 2014-09-12: qty 1

## 2014-09-12 MED ORDER — HYDROMORPHONE HCL 1 MG/ML IJ SOLN
0.2500 mg | INTRAMUSCULAR | Status: DC | PRN
  Administered 2014-09-12 (×4): 0.5 mg via INTRAVENOUS

## 2014-09-12 MED ORDER — HYDRALAZINE HCL 50 MG PO TABS
50.0000 mg | ORAL_TABLET | Freq: Two times a day (BID) | ORAL | Status: DC
  Administered 2014-09-12 (×2): 50 mg via ORAL
  Filled 2014-09-12 (×4): qty 1

## 2014-09-12 MED ORDER — SODIUM CHLORIDE 0.9 % IJ SOLN
3.0000 mL | Freq: Two times a day (BID) | INTRAMUSCULAR | Status: DC
  Administered 2014-09-12: 3 mL via INTRAVENOUS

## 2014-09-12 MED ORDER — LIDOCAINE HCL (CARDIAC) 20 MG/ML IV SOLN
INTRAVENOUS | Status: DC | PRN
  Administered 2014-09-12: 20 mg via INTRAVENOUS

## 2014-09-12 MED ORDER — CEFAZOLIN SODIUM 1-5 GM-% IV SOLN
1.0000 g | Freq: Three times a day (TID) | INTRAVENOUS | Status: AC
  Administered 2014-09-12 (×2): 1 g via INTRAVENOUS
  Filled 2014-09-12 (×2): qty 50

## 2014-09-12 MED ORDER — MENTHOL 3 MG MT LOZG: 1.0000 | LOZENGE | OROMUCOSAL | Status: DC | PRN

## 2014-09-12 MED ORDER — AMLODIPINE BESYLATE 5 MG PO TABS
5.0000 mg | ORAL_TABLET | Freq: Every day | ORAL | Status: DC
  Filled 2014-09-12: qty 1

## 2014-09-12 MED ORDER — LISINOPRIL 40 MG PO TABS
40.0000 mg | ORAL_TABLET | Freq: Every day | ORAL | Status: DC
  Administered 2014-09-12: 40 mg via ORAL
  Filled 2014-09-12 (×2): qty 1

## 2014-09-12 MED ORDER — GLYCOPYRROLATE 0.2 MG/ML IJ SOLN
INTRAMUSCULAR | Status: DC | PRN
  Administered 2014-09-12 (×2): 0.2 mg via INTRAVENOUS
  Administered 2014-09-12: 0.6 mg via INTRAVENOUS

## 2014-09-12 MED ORDER — MEPERIDINE HCL 25 MG/ML IJ SOLN: 6.2500 mg | INTRAMUSCULAR | Status: DC | PRN

## 2014-09-12 MED ORDER — HEMOSTATIC AGENTS (NO CHARGE) OPTIME
TOPICAL | Status: DC | PRN
  Administered 2014-09-12: 1 via TOPICAL

## 2014-09-12 MED ORDER — SODIUM CHLORIDE 0.9 % IJ SOLN
INTRAMUSCULAR | Status: AC
  Filled 2014-09-12: qty 10

## 2014-09-12 MED ORDER — METHOCARBAMOL 1000 MG/10ML IJ SOLN
500.0000 mg | Freq: Four times a day (QID) | INTRAVENOUS | Status: DC | PRN
  Filled 2014-09-12: qty 5

## 2014-09-12 MED ORDER — ARTIFICIAL TEARS OP OINT
TOPICAL_OINTMENT | OPHTHALMIC | Status: AC
  Filled 2014-09-12: qty 3.5

## 2014-09-12 MED ORDER — METHOCARBAMOL 500 MG PO TABS
500.0000 mg | ORAL_TABLET | Freq: Four times a day (QID) | ORAL | Status: DC | PRN
  Administered 2014-09-12 (×2): 500 mg via ORAL
  Filled 2014-09-12 (×2): qty 1

## 2014-09-12 MED ORDER — ARTIFICIAL TEARS OP OINT
TOPICAL_OINTMENT | OPHTHALMIC | Status: DC | PRN
  Administered 2014-09-12: 1 via OPHTHALMIC

## 2014-09-12 MED ORDER — LIDOCAINE HCL (CARDIAC) 20 MG/ML IV SOLN
INTRAVENOUS | Status: AC
  Filled 2014-09-12: qty 5

## 2014-09-12 MED ORDER — NEOSTIGMINE METHYLSULFATE 10 MG/10ML IV SOLN
INTRAVENOUS | Status: DC | PRN
  Administered 2014-09-12: 4 mg via INTRAVENOUS

## 2014-09-12 MED ORDER — OXYCODONE-ACETAMINOPHEN 5-325 MG PO TABS
1.0000 | ORAL_TABLET | ORAL | Status: DC | PRN
  Administered 2014-09-12 – 2014-09-13 (×4): 2 via ORAL
  Filled 2014-09-12 (×4): qty 2

## 2014-09-12 MED ORDER — HYDROCHLOROTHIAZIDE 25 MG PO TABS
25.0000 mg | ORAL_TABLET | Freq: Every day | ORAL | Status: DC
  Filled 2014-09-12: qty 1

## 2014-09-12 MED ORDER — LACTATED RINGERS IV SOLN
INTRAVENOUS | Status: DC | PRN
  Administered 2014-09-12 (×2): via INTRAVENOUS

## 2014-09-12 SURGICAL SUPPLY — 47 items
BAG DECANTER FOR FLEXI CONT (MISCELLANEOUS) ×2 IMPLANT
BENZOIN TINCTURE PRP APPL 2/3 (GAUZE/BANDAGES/DRESSINGS) ×2 IMPLANT
BUR MATCHSTICK NEURO 3.0 LAGG (BURR) ×2 IMPLANT
CANISTER SUCT 3000ML (MISCELLANEOUS) ×2 IMPLANT
CONT SPEC 4OZ CLIKSEAL STRL BL (MISCELLANEOUS) ×2 IMPLANT
DRAPE LAPAROTOMY 100X72X124 (DRAPES) ×2 IMPLANT
DRAPE MICROSCOPE LEICA (MISCELLANEOUS) ×2 IMPLANT
DRAPE POUCH INSTRU U-SHP 10X18 (DRAPES) ×2 IMPLANT
DRAPE SURG 17X23 STRL (DRAPES) ×2 IMPLANT
DRSG OPSITE 4X5.5 SM (GAUZE/BANDAGES/DRESSINGS) IMPLANT
DRSG OPSITE POSTOP 4X6 (GAUZE/BANDAGES/DRESSINGS) ×2 IMPLANT
DRSG TELFA 3X8 NADH (GAUZE/BANDAGES/DRESSINGS) IMPLANT
DURAPREP 26ML APPLICATOR (WOUND CARE) ×2 IMPLANT
ELECT REM PT RETURN 9FT ADLT (ELECTROSURGICAL) ×2
ELECTRODE REM PT RTRN 9FT ADLT (ELECTROSURGICAL) ×1 IMPLANT
GAUZE SPONGE 4X4 16PLY XRAY LF (GAUZE/BANDAGES/DRESSINGS) IMPLANT
GLOVE BIO SURGEON STRL SZ8 (GLOVE) ×2 IMPLANT
GLOVE BIOGEL PI IND STRL 7.5 (GLOVE) ×2 IMPLANT
GLOVE BIOGEL PI IND STRL 8 (GLOVE) ×2 IMPLANT
GLOVE BIOGEL PI INDICATOR 7.5 (GLOVE) ×2
GLOVE BIOGEL PI INDICATOR 8 (GLOVE) ×2
GLOVE ECLIPSE 7.5 STRL STRAW (GLOVE) ×6 IMPLANT
GLOVE SURG SS PI 7.5 STRL IVOR (GLOVE) ×2 IMPLANT
GOWN STRL REUS W/ TWL LRG LVL3 (GOWN DISPOSABLE) IMPLANT
GOWN STRL REUS W/ TWL XL LVL3 (GOWN DISPOSABLE) ×3 IMPLANT
GOWN STRL REUS W/TWL 2XL LVL3 (GOWN DISPOSABLE) ×2 IMPLANT
GOWN STRL REUS W/TWL LRG LVL3 (GOWN DISPOSABLE)
GOWN STRL REUS W/TWL XL LVL3 (GOWN DISPOSABLE) ×3
HEMOSTAT POWDER KIT SURGIFOAM (HEMOSTASIS) IMPLANT
KIT BASIN OR (CUSTOM PROCEDURE TRAY) ×2 IMPLANT
KIT ROOM TURNOVER OR (KITS) ×2 IMPLANT
NEEDLE HYPO 25X1 1.5 SAFETY (NEEDLE) ×2 IMPLANT
NEEDLE SPNL 20GX3.5 QUINCKE YW (NEEDLE) ×2 IMPLANT
NS IRRIG 1000ML POUR BTL (IV SOLUTION) ×2 IMPLANT
PACK LAMINECTOMY NEURO (CUSTOM PROCEDURE TRAY) ×2 IMPLANT
PAD ARMBOARD 7.5X6 YLW CONV (MISCELLANEOUS) ×6 IMPLANT
RUBBERBAND STERILE (MISCELLANEOUS) ×4 IMPLANT
SPONGE SURGIFOAM ABS GEL SZ50 (HEMOSTASIS) ×2 IMPLANT
STRIP CLOSURE SKIN 1/2X4 (GAUZE/BANDAGES/DRESSINGS) ×2 IMPLANT
SUT VIC AB 0 CT1 18XCR BRD8 (SUTURE) ×1 IMPLANT
SUT VIC AB 0 CT1 8-18 (SUTURE) ×1
SUT VIC AB 2-0 CP2 18 (SUTURE) ×2 IMPLANT
SUT VIC AB 3-0 SH 8-18 (SUTURE) ×4 IMPLANT
SYR 20ML ECCENTRIC (SYRINGE) ×2 IMPLANT
TOWEL OR 17X24 6PK STRL BLUE (TOWEL DISPOSABLE) ×2 IMPLANT
TOWEL OR 17X26 10 PK STRL BLUE (TOWEL DISPOSABLE) ×2 IMPLANT
WATER STERILE IRR 1000ML POUR (IV SOLUTION) ×2 IMPLANT

## 2014-09-12 NOTE — Anesthesia Procedure Notes (Signed)
Procedure Name: Intubation Date/Time: 09/12/2014 9:40 AM Performed by: Gaylene Brooks Pre-anesthesia Checklist: Patient identified, Timeout performed, Emergency Drugs available, Suction available and Patient being monitored Patient Re-evaluated:Patient Re-evaluated prior to inductionOxygen Delivery Method: Circle system utilized Preoxygenation: Pre-oxygenation with 100% oxygen Intubation Type: IV induction Ventilation: Mask ventilation without difficulty and Oral airway inserted - appropriate to patient size Laryngoscope Size: Sabra Heck and 2 Grade View: Grade I Tube type: Oral Tube size: 7.5 mm Number of attempts: 1 Airway Equipment and Method: Stylet Placement Confirmation: ETT inserted through vocal cords under direct vision,  breath sounds checked- equal and bilateral,  positive ETCO2 and CO2 detector Secured at: 24 cm Tube secured with: Tape Dental Injury: Teeth and Oropharynx as per pre-operative assessment

## 2014-09-12 NOTE — Op Note (Signed)
09/12/2014  11:54 AM  PATIENT:  [name]  80 y.o. male  PRE-OPERATIVE DIAGNOSIS:  Lumbar spinal stenosis L3-4 and L4-5  POST-OPERATIVE DIAGNOSIS:  Same  PROCEDURE:  Decompressive lumbar hemilaminectomy, medial facetectomy, and foraminotomies at L3-4 and L4-5 bilaterally  SURGEON:  Sherley Bounds, MD  ASSISTANTS: Dr. Ellene Route  ANESTHESIA:   General  EBL: Less than 100 ml  Total I/O In: 1300 [I.V.:1300] Out: -   BLOOD ADMINISTERED:none  DRAINS: None   SPECIMEN:  No Specimen  INDICATION FOR PROCEDURE: This patient presented with a long history of back and leg pain but slowly with walking. MRI showed severe stenosis at L3-4 and L4-5. He tried medical management without relief. I recommended decompressive hemilaminectomies. Patient understood the risks, benefits, and alternatives and potential outcomes and wished to proceed.  PROCEDURE DETAILS: The patient was taken to the operating room and after induction of adequate generalized endotracheal anesthesia, the patient was rolled into the prone position on the Wilson frame and all pressure points were padded. The lumbar region was cleaned and then prepped with DuraPrep and draped in the usual sterile fashion. 5 cc of local anesthesia was injected and then a dorsal midline incision was made and carried down to the lumbo sacral fascia. The fascia was opened and the paraspinous musculature was taken down in a subperiosteal fashion to expose L3-4 and L4-5 bilaterally. Intraoperative x-ray confirmed my level, and then I used a combination of the high-speed drill and the Kerrison punches to perform a hemilaminectomy, medial facetectomy, and foraminotomy at L3-4 and L4-5 bilaterally. The underlying yellow ligament was opened and removed in a piecemeal fashion to expose the underlying dura and exiting nerve root. I undercut the lateral recess and dissected down until I was medial to and distal to the pedicle. The nerve root was well decompressed. I  then palpated with a coronary dilator along the nerve root and into the foramen to assure adequate decompression. I felt no more compression of the nerve root. I irrigated with saline solution containing bacitracin. Achieved hemostasis with bipolar cautery, lined the dura with Gelfoam, and then closed the fascia with 0 Vicryl. I closed the subcutaneous tissues with 2-0 Vicryl and the subcuticular tissues with 3-0 Vicryl. The skin was then closed with benzoin and Steri-Strips. The drapes were removed, a sterile dressing was applied. The patient was awakened from general anesthesia and transferred to the recovery room in stable condition. At the end of the procedure all sponge, needle and instrument counts were correct.   PLAN OF CARE: Admit to inpatient   PATIENT DISPOSITION:  PACU - hemodynamically stable.   Delay start of Pharmacological VTE agent (>24hrs) due to surgical blood loss or risk of bleeding:  yes

## 2014-09-12 NOTE — Transfer of Care (Signed)
Immediate Anesthesia Transfer of Care Note  Patient: [name]  Procedure(s) Performed: Procedure(s): LUMBAR THREE-FOUR, LUMBAR FOUR-FIVE LUMBAR LAMINECTOMY/DECOMPRESSION MICRODISCECTOMY 2 LEVELS (Bilateral)  Patient Location: PACU  Anesthesia Type:General  Level of Consciousness: awake, alert  and oriented  Airway & Oxygen Therapy: Patient Spontanous Breathing and Patient connected to face mask oxygen  Post-op Assessment: Report given to RN, Post -op Vital signs reviewed and stable and Patient moving all extremities X 4  Post vital signs: Reviewed and stable  Last Vitals:  Filed Vitals:   09/12/14 0800  BP: 193/68  Pulse:   Temp:   Resp:     Complications: No apparent anesthesia complications

## 2014-09-12 NOTE — Anesthesia Postprocedure Evaluation (Signed)
  Anesthesia Post-op Note  Patient: Clebert Wenger  Procedure(s) Performed: Procedure(s): LUMBAR THREE-FOUR, LUMBAR FOUR-FIVE LUMBAR LAMINECTOMY/DECOMPRESSION MICRODISCECTOMY 2 LEVELS (Bilateral)  Patient Location: PACU  Anesthesia Type:General  Level of Consciousness: awake, alert , oriented and patient cooperative  Airway and Oxygen Therapy: Patient Spontanous Breathing  Post-op Pain: mild  Post-op Assessment: Post-op Vital signs reviewed, Patient's Cardiovascular Status Stable, Respiratory Function Stable, Patent Airway, No signs of Nausea or vomiting and Pain level controlled  Post-op Vital Signs: Reviewed and stable  Last Vitals:  Filed Vitals:   09/12/14 1320  BP:   Pulse: 69  Temp: 36.4 C  Resp: 23    Complications: No apparent anesthesia complications

## 2014-09-12 NOTE — H&P (Signed)
Subjective: Patient is a 80 y.o. male admitted for lumbar stenosis. Onset of symptoms was several months ago, gradually worsening since that time.  The pain is rated moderate, and is located at the across the lower back and radiates to legs. The pain is described as aching and occurs intermittently. The symptoms have been progressive. Symptoms are exacerbated by exercise. MRI or CT showed stenosis.   Past Medical History  Diagnosis Date  . Prostate cancer   . Hypertension   . High cholesterol   . Kidney stones   . Bronchitis   . Diabetes mellitus without complication     on meds  . Depression   . Anxiety   . GERD (gastroesophageal reflux disease)     Past Surgical History  Procedure Laterality Date  . Lymphnodes removed    . Kidney stone surgery    . Tonsillectomy      age 25    Prior to Admission medications   Medication Sig Start Date End Date Taking? Authorizing Provider  acetaminophen (TYLENOL) 500 MG tablet Take 1,000 mg by mouth at bedtime as needed (pain).   Yes Historical Provider, MD  amLODipine (NORVASC) 5 MG tablet Take 5 mg by mouth at bedtime.  08/12/13  Yes Historical Provider, MD  atorvastatin (LIPITOR) 10 MG tablet Take 10 mg by mouth at bedtime.  08/12/13  Yes Historical Provider, MD  citalopram (CELEXA) 40 MG tablet Take 40 mg by mouth daily.  08/09/13  Yes Historical Provider, MD  gabapentin (NEURONTIN) 300 MG capsule Take 600 mg by mouth 2 (two) times daily.    Yes Historical Provider, MD  hydrALAZINE (APRESOLINE) 25 MG tablet Take 50 mg by mouth 2 (two) times daily.  08/12/13  Yes Historical Provider, MD  insulin glargine (LANTUS) 100 UNIT/ML injection Inject 15 Units into the skin.  01/11/14  Yes Historical Provider, MD  insulin lispro (HUMALOG) 100 UNIT/ML injection Inject 5-20 Units into the skin 3 (three) times daily before meals. Based on sliding scale   Yes Historical Provider, MD  lisinopril (PRINIVIL,ZESTRIL) 40 MG tablet Take 40 mg by mouth at bedtime.    Yes  Historical Provider, MD  MAGNESIUM PO Take 1 tablet by mouth 2 (two) times daily.   Yes Historical Provider, MD  Misc Natural Products (OSTEO BI-FLEX TRIPLE STRENGTH) TABS Take 1 tablet by mouth 2 (two) times daily.   Yes Historical Provider, MD  Omega-3 Fatty Acids (FISH OIL PO) Take 1 capsule by mouth 2 (two) times daily.   Yes Historical Provider, MD  temazepam (RESTORIL) 30 MG capsule Take 30 mg by mouth at bedtime as needed for sleep.  11/04/13  Yes Historical Provider, MD  valsartan-hydrochlorothiazide (DIOVAN-HCT) 320-25 MG per tablet Take 1 tablet by mouth daily.  05/09/14  Yes Historical Provider, MD  naproxen sodium (ANAPROX) 220 MG tablet Take 440 mg by mouth at bedtime as needed (pain).    Historical Provider, MD   No Known Allergies  History  Substance Use Topics  . Smoking status: Former Smoker -- 2.00 packs/day    Types: Cigarettes  . Smokeless tobacco: Never Used  . Alcohol Use: No    History reviewed. No pertinent family history.   Review of Systems  Positive ROS: neg  All other systems have been reviewed and were otherwise negative with the exception of those mentioned in the HPI and as above.  Objective: Vital signs in last 24 hours:    General Appearance: Alert, cooperative, no distress, appears stated age Head: Normocephalic,  without obvious abnormality, atraumatic Eyes: PERRL, conjunctiva/corneas clear, EOM's intact    Neck: Supple, symmetrical, trachea midline Back: Symmetric, no curvature, ROM normal, no CVA tenderness Lungs:  respirations unlabored Heart: Regular rate and rhythm Abdomen: Soft, non-tender Extremities: Extremities normal, atraumatic, no cyanosis or edema Pulses: 2+ and symmetric all extremities Skin: Skin color, texture, turgor normal, no rashes or lesions  NEUROLOGIC:   Mental status: Alert and oriented x4,  no aphasia, good attention span, fund of knowledge, and memory Motor Exam - grossly normal Sensory Exam - grossly  normal Reflexes: 1= Coordination - grossly normal Gait - grossly normal Balance - grossly normal Cranial Nerves: I: smell Not tested  II: visual acuity  OS: nl    OD: nl  II: visual fields Full to confrontation  II: pupils Equal, round, reactive to light  III,VII: ptosis None  III,IV,VI: extraocular muscles  Full ROM  V: mastication Normal  V: facial light touch sensation  Normal  V,VII: corneal reflex  Present  VII: facial muscle function - upper  Normal  VII: facial muscle function - lower Normal  VIII: hearing Not tested  IX: soft palate elevation  Normal  IX,X: gag reflex Present  XI: trapezius strength  5/5  XI: sternocleidomastoid strength 5/5  XI: neck flexion strength  5/5  XII: tongue strength  Normal    Data Review Lab Results  Component Value Date   WBC 6.8 09/08/2014   HGB 12.4* 09/08/2014   HCT 35.6* 09/08/2014   MCV 90.4 09/08/2014   PLT 133* 09/08/2014   Lab Results  Component Value Date   NA 137 09/08/2014   K 4.1 09/08/2014   CL 106 09/08/2014   CO2 23 09/08/2014   BUN 27* 09/08/2014   CREATININE 1.38* 09/08/2014   GLUCOSE 134* 09/08/2014   Lab Results  Component Value Date   INR 1.08 09/08/2014    Assessment/Plan: Patient admitted for [facility] for stenosis. Patient has failed a reasonable attempt at conservative therapy.  I explained the condition and procedure to the patient and answered any questions.  Patient wishes to proceed with procedure as planned. Understands risks/ benefits and typical outcomes of procedure.   Dannon Perlow S 09/12/2014 7:29 AM

## 2014-09-12 NOTE — Anesthesia Preprocedure Evaluation (Addendum)
Anesthesia Evaluation  Patient identified by MRN, date of birth, ID band Patient awake    Reviewed: Allergy & Precautions, NPO status , Patient's Chart, lab work & pertinent test results  History of Anesthesia Complications Negative for: history of anesthetic complications  Airway Mallampati: II  TM Distance: >3 FB Neck ROM: Full    Dental  (+) Edentulous Upper, Edentulous Lower   Pulmonary Current Smoker, former smoker,  breath sounds clear to auscultation        Cardiovascular hypertension, Pt. on medications - anginaRhythm:Regular Rate:Normal  '14 stress test: normal wall motion and thickening with EF calculated at 71%. No significant areas of reversible hypoperfusion are identified. This is a low risk myocardial perfusion stress test.   Neuro/Psych Chronic back pain    GI/Hepatic Neg liver ROS, GERD-  Medicated and Controlled,  Endo/Other  diabetes (glu 141), Insulin DependentMorbid obesity  Renal/GU Renal InsufficiencyRenal disease (creat 1.38)     Musculoskeletal   Abdominal (+) + obese,   Peds  Hematology   Anesthesia Other Findings   Reproductive/Obstetrics                            Anesthesia Physical Anesthesia Plan  ASA: III  Anesthesia Plan: General   Post-op Pain Management:    Induction: Intravenous  Airway Management Planned: Oral ETT  Additional Equipment:   Intra-op Plan:   Post-operative Plan: Extubation in OR  Informed Consent: I have reviewed the patients History and Physical, chart, labs and discussed the procedure including the risks, benefits and alternatives for the proposed anesthesia with the patient or authorized representative who has indicated his/her understanding and acceptance.   Dental advisory given  Plan Discussed with: CRNA and Surgeon  Anesthesia Plan Comments: (Plan routine monitors, GETA)        Anesthesia Quick Evaluation

## 2014-09-13 LAB — GLUCOSE, CAPILLARY
Glucose-Capillary: 183 mg/dL — ABNORMAL HIGH (ref 70–99)
Glucose-Capillary: 141 mg/dL — ABNORMAL HIGH (ref 70–99)

## 2014-09-13 MED ORDER — HYDROCODONE-ACETAMINOPHEN 10-325 MG PO TABS: 1.0000 | ORAL_TABLET | ORAL | Status: DC | PRN

## 2014-09-13 MED ORDER — HYDROCODONE-ACETAMINOPHEN 10-325 MG PO TABS
1.0000 | ORAL_TABLET | ORAL | Status: DC | PRN
  Administered 2014-09-13: 1 via ORAL
  Filled 2014-09-13: qty 1

## 2014-09-13 NOTE — Discharge Summary (Signed)
Physician Discharge Summary  Patient ID: [name] MRN: [id number] DOB/AGE: August 06, 1935 80 y.o.  Admit date: 09/12/2014 Discharge date: 09/13/2014  Admission Diagnoses:Lumbar spinal stenosis L 34 and L 45 levels  Discharge Diagnoses: Lumbar spinal stenosis L 34 and L 45 levels Active Problems:   S/P lumbar laminectomy   Discharged Condition: good  Hospital Course: Uncomplicated decompressive lumbar laminectomy for spinal stenosis L 34 and L 45 levels  Consults: None  Significant Diagnostic Studies: None  Treatments: surgery: decompressive lumbar laminectomy for spinal stenosis L 34 and L 45 levels  Discharge Exam: Blood pressure 128/46, pulse 63, temperature 98.2 F (36.8 C), temperature source Oral, resp. rate 18, SpO2 97 %. Neurologic: Alert and oriented X 3, normal strength and tone. Normal symmetric reflexes. Normal coordination and gait Wound:CDI  Disposition: Home     Medication List    TAKE these medications        acetaminophen 500 MG tablet  Commonly known as:  TYLENOL  Take 1,000 mg by mouth at bedtime as needed (pain).     amLODipine 5 MG tablet  Commonly known as:  NORVASC  Take 5 mg by mouth at bedtime.     atorvastatin 10 MG tablet  Commonly known as:  LIPITOR  Take 10 mg by mouth at bedtime.     citalopram 40 MG tablet  Commonly known as:  CELEXA  Take 40 mg by mouth daily.     FISH OIL PO  Take 1 capsule by mouth 2 (two) times daily.     gabapentin 300 MG capsule  Commonly known as:  NEURONTIN  Take 600 mg by mouth 2 (two) times daily.     hydrALAZINE 25 MG tablet  Commonly known as:  APRESOLINE  Take 50 mg by mouth 2 (two) times daily.     HYDROcodone-acetaminophen 10-325 MG per tablet  Commonly known as:  NORCO  Take 1 tablet by mouth every 4 (four) hours as needed for moderate pain.     insulin lispro 100 UNIT/ML injection  Commonly known as:  HUMALOG  Inject 5-20 Units into the skin 3 (three) times daily before meals. Based on  sliding scale     LANTUS 100 UNIT/ML injection  Generic drug:  insulin glargine  Inject 15 Units into the skin.     lisinopril 40 MG tablet  Commonly known as:  PRINIVIL,ZESTRIL  Take 40 mg by mouth at bedtime.     MAGNESIUM PO  Take 1 tablet by mouth 2 (two) times daily.     naproxen sodium 220 MG tablet  Commonly known as:  ANAPROX  Take 440 mg by mouth at bedtime as needed (pain).     OSTEO BI-FLEX TRIPLE STRENGTH Tabs  Take 1 tablet by mouth 2 (two) times daily.     temazepam 30 MG capsule  Commonly known as:  RESTORIL  Take 30 mg by mouth at bedtime as needed for sleep.     valsartan-hydrochlorothiazide 320-25 MG per tablet  Commonly known as:  DIOVAN-HCT  Take 1 tablet by mouth daily.         Signed: Peggyann Shoals, MD 09/13/2014, 8:47 AM

## 2014-09-13 NOTE — Progress Notes (Signed)
Pt and wife given D/C instructions with Rx, verbal understanding was provided. Pt's incision is covered with Honeycomb dressing and has no sign of infection. Pt's IV was removed prior to D/C. Pt D/C'd home via wheelchair @ 0945 per MD order. Pt is stable @ D/C and has no other needs at this time. Holli Humbles, RN

## 2014-09-13 NOTE — Progress Notes (Signed)
Subjective: Patient reports doing well  Objective: Vital signs in last 24 hours: Temp:  [97.5 F (36.4 C)-98.2 F (36.8 C)] 98.2 F (36.8 C) (02/06 0823) Pulse Rate:  [58-91] 63 (02/06 0823) Resp:  [15-29] 18 (02/06 0823) BP: (113-154)/(45-64) 128/46 mmHg (02/06 0823) SpO2:  [91 %-99 %] 97 % (02/06 0823)  Intake/Output from previous day: 02/05 0701 - 02/06 0700 In: 1780 [P.O.:480; I.V.:1300] Out: -  Intake/Output this shift:    Physical Exam: Full strength.  Dressing CDI  Lab Results: No results for input(s): WBC, HGB, HCT, PLT in the last 72 hours. BMET No results for input(s): NA, K, CL, CO2, GLUCOSE, BUN, CREATININE, CALCIUM in the last 72 hours.  Studies/Results: Dg Lumbar Spine 2-3 Views  09/12/2014   CLINICAL DATA:  Lumbar stenosis.  EXAM: LUMBAR SPINE - 2-3 VIEW  COMPARISON:  MRI scan of March 12, 2014.  FINDINGS: Two lateral intraoperative projections of the lumbar spine were obtained. The first image demonstrates surgical probe directed toward the posterior portion of the L4-5 disc space. The second image demonstrates surgical probe directed toward posterior portion of L3-4 disc space.  IMPRESSION: Surgical localization of L3-4 and L4-5 as described above.   Electronically Signed   By: Sabino Dick M.D.   On: 09/12/2014 11:31    Assessment/Plan: Doing well.  Discharge home.    LOS: 1 day    Peggyann Shoals, MD 09/13/2014, 8:45 AM

## 2014-09-15 ENCOUNTER — Encounter (HOSPITAL_COMMUNITY): Payer: Self-pay | Admitting: Neurological Surgery

## 2014-11-07 ENCOUNTER — Ambulatory Visit: Payer: Medicare Other

## 2014-11-17 DIAGNOSIS — D649 Anemia, unspecified: Secondary | ICD-10-CM | POA: Diagnosis not present

## 2014-11-17 DIAGNOSIS — M6281 Muscle weakness (generalized): Secondary | ICD-10-CM | POA: Diagnosis not present

## 2014-11-17 DIAGNOSIS — M4806 Spinal stenosis, lumbar region: Secondary | ICD-10-CM | POA: Diagnosis not present

## 2014-11-17 DIAGNOSIS — E785 Hyperlipidemia, unspecified: Secondary | ICD-10-CM | POA: Diagnosis not present

## 2014-11-17 DIAGNOSIS — E1140 Type 2 diabetes mellitus with diabetic neuropathy, unspecified: Secondary | ICD-10-CM | POA: Diagnosis not present

## 2014-11-19 DIAGNOSIS — E785 Hyperlipidemia, unspecified: Secondary | ICD-10-CM | POA: Diagnosis not present

## 2014-11-19 DIAGNOSIS — F5104 Psychophysiologic insomnia: Secondary | ICD-10-CM | POA: Diagnosis not present

## 2014-11-19 DIAGNOSIS — E1142 Type 2 diabetes mellitus with diabetic polyneuropathy: Secondary | ICD-10-CM | POA: Diagnosis not present

## 2014-11-19 DIAGNOSIS — D649 Anemia, unspecified: Secondary | ICD-10-CM | POA: Diagnosis not present

## 2014-11-19 DIAGNOSIS — L821 Other seborrheic keratosis: Secondary | ICD-10-CM | POA: Diagnosis not present

## 2014-11-19 DIAGNOSIS — L570 Actinic keratosis: Secondary | ICD-10-CM | POA: Diagnosis not present

## 2014-11-19 DIAGNOSIS — Z6835 Body mass index (BMI) 35.0-35.9, adult: Secondary | ICD-10-CM | POA: Diagnosis not present

## 2014-11-19 DIAGNOSIS — I10 Essential (primary) hypertension: Secondary | ICD-10-CM | POA: Diagnosis not present

## 2014-11-19 DIAGNOSIS — C44229 Squamous cell carcinoma of skin of left ear and external auricular canal: Secondary | ICD-10-CM | POA: Diagnosis not present

## 2014-11-20 ENCOUNTER — Ambulatory Visit (INDEPENDENT_AMBULATORY_CARE_PROVIDER_SITE_OTHER): Payer: Medicare Other | Admitting: Podiatrist

## 2014-11-20 ENCOUNTER — Encounter: Payer: Self-pay | Admitting: Podiatrist

## 2014-11-20 VITALS — BP 181/86 | HR 64 | Resp 18

## 2014-11-20 DIAGNOSIS — E1140 Type 2 diabetes mellitus with diabetic neuropathy, unspecified: Secondary | ICD-10-CM

## 2014-11-20 DIAGNOSIS — L603 Nail dystrophy: Secondary | ICD-10-CM

## 2014-11-20 DIAGNOSIS — M79676 Pain in unspecified toe(s): Secondary | ICD-10-CM

## 2014-11-20 DIAGNOSIS — B351 Tinea unguium: Secondary | ICD-10-CM

## 2014-11-21 ENCOUNTER — Ambulatory Visit: Payer: Medicare Other

## 2014-11-21 DIAGNOSIS — M6281 Muscle weakness (generalized): Secondary | ICD-10-CM | POA: Diagnosis not present

## 2014-11-21 DIAGNOSIS — M4806 Spinal stenosis, lumbar region: Secondary | ICD-10-CM | POA: Diagnosis not present

## 2014-11-21 NOTE — Progress Notes (Signed)

## 2014-11-26 DIAGNOSIS — M4806 Spinal stenosis, lumbar region: Secondary | ICD-10-CM | POA: Diagnosis not present

## 2014-11-26 DIAGNOSIS — M6281 Muscle weakness (generalized): Secondary | ICD-10-CM | POA: Diagnosis not present

## 2014-11-28 DIAGNOSIS — M4806 Spinal stenosis, lumbar region: Secondary | ICD-10-CM | POA: Diagnosis not present

## 2014-11-28 DIAGNOSIS — M6281 Muscle weakness (generalized): Secondary | ICD-10-CM | POA: Diagnosis not present

## 2014-12-03 DIAGNOSIS — M6281 Muscle weakness (generalized): Secondary | ICD-10-CM | POA: Diagnosis not present

## 2014-12-03 DIAGNOSIS — M4806 Spinal stenosis, lumbar region: Secondary | ICD-10-CM | POA: Diagnosis not present

## 2014-12-05 DIAGNOSIS — M6281 Muscle weakness (generalized): Secondary | ICD-10-CM | POA: Diagnosis not present

## 2014-12-05 DIAGNOSIS — M4806 Spinal stenosis, lumbar region: Secondary | ICD-10-CM | POA: Diagnosis not present

## 2014-12-17 DIAGNOSIS — M4806 Spinal stenosis, lumbar region: Secondary | ICD-10-CM | POA: Diagnosis not present

## 2014-12-17 DIAGNOSIS — M6281 Muscle weakness (generalized): Secondary | ICD-10-CM | POA: Diagnosis not present

## 2014-12-19 DIAGNOSIS — J208 Acute bronchitis due to other specified organisms: Secondary | ICD-10-CM | POA: Diagnosis not present

## 2014-12-19 DIAGNOSIS — J019 Acute sinusitis, unspecified: Secondary | ICD-10-CM | POA: Diagnosis not present

## 2014-12-19 DIAGNOSIS — Z760 Encounter for issue of repeat prescription: Secondary | ICD-10-CM | POA: Diagnosis not present

## 2014-12-19 DIAGNOSIS — M4806 Spinal stenosis, lumbar region: Secondary | ICD-10-CM | POA: Diagnosis not present

## 2014-12-19 DIAGNOSIS — Z6836 Body mass index (BMI) 36.0-36.9, adult: Secondary | ICD-10-CM | POA: Diagnosis not present

## 2014-12-24 DIAGNOSIS — J208 Acute bronchitis due to other specified organisms: Secondary | ICD-10-CM | POA: Diagnosis not present

## 2014-12-24 DIAGNOSIS — Z6835 Body mass index (BMI) 35.0-35.9, adult: Secondary | ICD-10-CM | POA: Diagnosis not present

## 2015-01-01 DIAGNOSIS — Z6838 Body mass index (BMI) 38.0-38.9, adult: Secondary | ICD-10-CM | POA: Diagnosis not present

## 2015-01-01 DIAGNOSIS — J208 Acute bronchitis due to other specified organisms: Secondary | ICD-10-CM | POA: Diagnosis not present

## 2015-01-01 DIAGNOSIS — E8770 Fluid overload, unspecified: Secondary | ICD-10-CM | POA: Diagnosis not present

## 2015-01-02 DIAGNOSIS — J208 Acute bronchitis due to other specified organisms: Secondary | ICD-10-CM | POA: Diagnosis not present

## 2015-01-02 DIAGNOSIS — R05 Cough: Secondary | ICD-10-CM | POA: Diagnosis not present

## 2015-01-02 DIAGNOSIS — J029 Acute pharyngitis, unspecified: Secondary | ICD-10-CM | POA: Diagnosis not present

## 2015-01-13 DIAGNOSIS — I10 Essential (primary) hypertension: Secondary | ICD-10-CM | POA: Diagnosis not present

## 2015-01-13 DIAGNOSIS — Z6835 Body mass index (BMI) 35.0-35.9, adult: Secondary | ICD-10-CM | POA: Diagnosis not present

## 2015-01-13 DIAGNOSIS — M4806 Spinal stenosis, lumbar region: Secondary | ICD-10-CM | POA: Diagnosis not present

## 2015-01-20 DIAGNOSIS — M6281 Muscle weakness (generalized): Secondary | ICD-10-CM | POA: Diagnosis not present

## 2015-01-20 DIAGNOSIS — M4806 Spinal stenosis, lumbar region: Secondary | ICD-10-CM | POA: Diagnosis not present

## 2015-01-22 DIAGNOSIS — M4806 Spinal stenosis, lumbar region: Secondary | ICD-10-CM | POA: Diagnosis not present

## 2015-01-22 DIAGNOSIS — M6281 Muscle weakness (generalized): Secondary | ICD-10-CM | POA: Diagnosis not present

## 2015-01-27 DIAGNOSIS — M6281 Muscle weakness (generalized): Secondary | ICD-10-CM | POA: Diagnosis not present

## 2015-01-27 DIAGNOSIS — M4806 Spinal stenosis, lumbar region: Secondary | ICD-10-CM | POA: Diagnosis not present

## 2015-01-29 DIAGNOSIS — M6281 Muscle weakness (generalized): Secondary | ICD-10-CM | POA: Diagnosis not present

## 2015-01-29 DIAGNOSIS — M4806 Spinal stenosis, lumbar region: Secondary | ICD-10-CM | POA: Diagnosis not present

## 2015-01-29 DIAGNOSIS — C61 Malignant neoplasm of prostate: Secondary | ICD-10-CM | POA: Diagnosis not present

## 2015-02-02 DIAGNOSIS — M6281 Muscle weakness (generalized): Secondary | ICD-10-CM | POA: Diagnosis not present

## 2015-02-02 DIAGNOSIS — M4806 Spinal stenosis, lumbar region: Secondary | ICD-10-CM | POA: Diagnosis not present

## 2015-02-04 DIAGNOSIS — M4806 Spinal stenosis, lumbar region: Secondary | ICD-10-CM | POA: Diagnosis not present

## 2015-02-04 DIAGNOSIS — M6281 Muscle weakness (generalized): Secondary | ICD-10-CM | POA: Diagnosis not present

## 2015-02-06 DIAGNOSIS — R351 Nocturia: Secondary | ICD-10-CM | POA: Diagnosis not present

## 2015-02-06 DIAGNOSIS — C61 Malignant neoplasm of prostate: Secondary | ICD-10-CM | POA: Diagnosis not present

## 2015-02-06 DIAGNOSIS — N200 Calculus of kidney: Secondary | ICD-10-CM | POA: Diagnosis not present

## 2015-02-11 DIAGNOSIS — M4806 Spinal stenosis, lumbar region: Secondary | ICD-10-CM | POA: Diagnosis not present

## 2015-02-11 DIAGNOSIS — M6281 Muscle weakness (generalized): Secondary | ICD-10-CM | POA: Diagnosis not present

## 2015-02-18 DIAGNOSIS — M6281 Muscle weakness (generalized): Secondary | ICD-10-CM | POA: Diagnosis not present

## 2015-02-18 DIAGNOSIS — M4806 Spinal stenosis, lumbar region: Secondary | ICD-10-CM | POA: Diagnosis not present

## 2015-02-23 ENCOUNTER — Ambulatory Visit (INDEPENDENT_AMBULATORY_CARE_PROVIDER_SITE_OTHER): Payer: Medicare Other | Admitting: Podiatry

## 2015-02-23 ENCOUNTER — Encounter: Payer: Self-pay | Admitting: Podiatry

## 2015-02-23 VITALS — BP 192/84 | HR 64 | Resp 18

## 2015-02-23 DIAGNOSIS — E1149 Type 2 diabetes mellitus with other diabetic neurological complication: Secondary | ICD-10-CM | POA: Diagnosis not present

## 2015-02-23 DIAGNOSIS — B351 Tinea unguium: Secondary | ICD-10-CM

## 2015-02-23 DIAGNOSIS — M79676 Pain in unspecified toe(s): Secondary | ICD-10-CM

## 2015-02-23 DIAGNOSIS — E1140 Type 2 diabetes mellitus with diabetic neuropathy, unspecified: Secondary | ICD-10-CM

## 2015-02-23 NOTE — Progress Notes (Signed)
Patient ID: [name], male   DOB: 22-Apr-1935, 80 y.o.   MRN: [id number] Complaint:  Visit Type: Patient returns to my office for continued preventative foot care services. Complaint: Patient states" my nails have grown long and thick and become painful to walk and wear shoes" Patient has been diagnosed with DM with neuropathy. He presents for preventative foot care services. No changes to ROS  Podiatric Exam: Vascular: dorsalis pedis and posterior tibial pulses are palpable bilateral. Capillary return is immediate. Temperature gradient is WNL. Skin turgor WNL  Sensorium: Normal Semmes Weinstein monofilament test. Normal tactile sensation bilaterally. Nail Exam: Pt has thick disfigured discolored nails with subungual debris noted bilateral entire nail hallux through fifth toenails Ulcer Exam: There is no evidence of ulcer or pre-ulcerative changes or infection. Orthopedic Exam: Muscle tone and strength are WNL. No limitations in general ROM. No crepitus or effusions noted. Foot type and digits show no abnormalities. Bony prominences are unremarkable. Skin: No Porokeratosis. No infection or ulcers  Diagnosis:  Tinea unguium, Pain in right toe, pain in left toes  Treatment & Plan Procedures and Treatment: Consent by patient was obtained for treatment procedures. The patient understood the discussion of treatment and procedures well. All questions were answered thoroughly reviewed. Debridement of mycotic and hypertrophic toenails, 1 through 5 bilateral and clearing of subungual debris. No ulceration, no infection noted.  Return Visit-Office Procedure: Patient instructed to return to the office for a follow up visit 3 months for continued evaluation and treatment.

## 2015-02-24 DIAGNOSIS — E785 Hyperlipidemia, unspecified: Secondary | ICD-10-CM | POA: Diagnosis not present

## 2015-02-24 DIAGNOSIS — D649 Anemia, unspecified: Secondary | ICD-10-CM | POA: Diagnosis not present

## 2015-02-24 DIAGNOSIS — E1142 Type 2 diabetes mellitus with diabetic polyneuropathy: Secondary | ICD-10-CM | POA: Diagnosis not present

## 2015-02-25 DIAGNOSIS — M4806 Spinal stenosis, lumbar region: Secondary | ICD-10-CM | POA: Diagnosis not present

## 2015-02-25 DIAGNOSIS — M6281 Muscle weakness (generalized): Secondary | ICD-10-CM | POA: Diagnosis not present

## 2015-02-26 DIAGNOSIS — F17210 Nicotine dependence, cigarettes, uncomplicated: Secondary | ICD-10-CM | POA: Diagnosis not present

## 2015-02-26 DIAGNOSIS — Z6835 Body mass index (BMI) 35.0-35.9, adult: Secondary | ICD-10-CM | POA: Diagnosis not present

## 2015-02-26 DIAGNOSIS — N189 Chronic kidney disease, unspecified: Secondary | ICD-10-CM | POA: Diagnosis not present

## 2015-02-26 DIAGNOSIS — Z23 Encounter for immunization: Secondary | ICD-10-CM | POA: Diagnosis not present

## 2015-02-26 DIAGNOSIS — I10 Essential (primary) hypertension: Secondary | ICD-10-CM | POA: Diagnosis not present

## 2015-02-26 DIAGNOSIS — E1142 Type 2 diabetes mellitus with diabetic polyneuropathy: Secondary | ICD-10-CM | POA: Diagnosis not present

## 2015-02-26 DIAGNOSIS — E538 Deficiency of other specified B group vitamins: Secondary | ICD-10-CM | POA: Diagnosis not present

## 2015-02-26 DIAGNOSIS — E785 Hyperlipidemia, unspecified: Secondary | ICD-10-CM | POA: Diagnosis not present

## 2015-03-17 DIAGNOSIS — M4806 Spinal stenosis, lumbar region: Secondary | ICD-10-CM | POA: Diagnosis not present

## 2015-05-21 DIAGNOSIS — Z23 Encounter for immunization: Secondary | ICD-10-CM | POA: Diagnosis not present

## 2015-05-25 ENCOUNTER — Ambulatory Visit (INDEPENDENT_AMBULATORY_CARE_PROVIDER_SITE_OTHER): Payer: Medicare Other | Admitting: Podiatry

## 2015-05-25 ENCOUNTER — Encounter: Payer: Self-pay | Admitting: Podiatry

## 2015-05-25 DIAGNOSIS — M79676 Pain in unspecified toe(s): Secondary | ICD-10-CM | POA: Diagnosis not present

## 2015-05-25 DIAGNOSIS — E1140 Type 2 diabetes mellitus with diabetic neuropathy, unspecified: Secondary | ICD-10-CM

## 2015-05-25 DIAGNOSIS — B351 Tinea unguium: Secondary | ICD-10-CM | POA: Diagnosis not present

## 2015-05-25 NOTE — Progress Notes (Signed)
Patient ID: [name], male   DOB: 03/11/1935, 80 y.o.   MRN: [id number] Complaint:  Visit Type: Patient returns to my office for continued preventative foot care services. Complaint: Patient states" my nails have grown long and thick and become painful to walk and wear shoes" Patient has been diagnosed with DM with neuropathy. He presents for preventative foot care services. No changes to ROS  Podiatric Exam: Vascular: dorsalis pedis and posterior tibial pulses are palpable bilateral. Capillary return is immediate. Temperature gradient is WNL. Skin turgor WNL  Sensorium: Normal Semmes Weinstein monofilament test. Normal tactile sensation bilaterally. Nail Exam: Pt has thick disfigured discolored nails with subungual debris noted bilateral entire nail hallux through fifth toenails Ulcer Exam: There is no evidence of ulcer or pre-ulcerative changes or infection. Orthopedic Exam: Muscle tone and strength are WNL. No limitations in general ROM. No crepitus or effusions noted. Foot type and digits show no abnormalities. Bony prominences are unremarkable. Skin: No Porokeratosis. No infection or ulcers  Diagnosis:  Tinea unguium, Pain in right toe, pain in left toes  Treatment & Plan Procedures and Treatment: Consent by patient was obtained for treatment procedures. The patient understood the discussion of treatment and procedures well. All questions were answered thoroughly reviewed. Debridement of mycotic and hypertrophic toenails, 1 through 5 bilateral and clearing of subungual debris. No ulceration, no infection noted.  Return Visit-Office Procedure: Patient instructed to return to the office for a follow up visit 3 months for continued evaluation and treatment. 

## 2015-06-03 DIAGNOSIS — N189 Chronic kidney disease, unspecified: Secondary | ICD-10-CM | POA: Diagnosis not present

## 2015-06-03 DIAGNOSIS — E1142 Type 2 diabetes mellitus with diabetic polyneuropathy: Secondary | ICD-10-CM | POA: Diagnosis not present

## 2015-06-03 DIAGNOSIS — E785 Hyperlipidemia, unspecified: Secondary | ICD-10-CM | POA: Diagnosis not present

## 2015-06-05 DIAGNOSIS — Z6836 Body mass index (BMI) 36.0-36.9, adult: Secondary | ICD-10-CM | POA: Diagnosis not present

## 2015-06-05 DIAGNOSIS — Z9181 History of falling: Secondary | ICD-10-CM | POA: Diagnosis not present

## 2015-06-05 DIAGNOSIS — I10 Essential (primary) hypertension: Secondary | ICD-10-CM | POA: Diagnosis not present

## 2015-06-05 DIAGNOSIS — E1142 Type 2 diabetes mellitus with diabetic polyneuropathy: Secondary | ICD-10-CM | POA: Diagnosis not present

## 2015-06-05 DIAGNOSIS — E785 Hyperlipidemia, unspecified: Secondary | ICD-10-CM | POA: Diagnosis not present

## 2015-06-05 DIAGNOSIS — N189 Chronic kidney disease, unspecified: Secondary | ICD-10-CM | POA: Diagnosis not present

## 2015-06-19 DIAGNOSIS — H6123 Impacted cerumen, bilateral: Secondary | ICD-10-CM | POA: Diagnosis not present

## 2015-06-19 DIAGNOSIS — Z6836 Body mass index (BMI) 36.0-36.9, adult: Secondary | ICD-10-CM | POA: Diagnosis not present

## 2015-06-19 DIAGNOSIS — E1122 Type 2 diabetes mellitus with diabetic chronic kidney disease: Secondary | ICD-10-CM | POA: Diagnosis not present

## 2015-06-19 DIAGNOSIS — E1140 Type 2 diabetes mellitus with diabetic neuropathy, unspecified: Secondary | ICD-10-CM | POA: Diagnosis not present

## 2015-06-19 DIAGNOSIS — R42 Dizziness and giddiness: Secondary | ICD-10-CM | POA: Diagnosis not present

## 2015-06-19 DIAGNOSIS — I10 Essential (primary) hypertension: Secondary | ICD-10-CM | POA: Diagnosis not present

## 2015-07-17 DIAGNOSIS — Z6836 Body mass index (BMI) 36.0-36.9, adult: Secondary | ICD-10-CM | POA: Diagnosis not present

## 2015-07-17 DIAGNOSIS — E1122 Type 2 diabetes mellitus with diabetic chronic kidney disease: Secondary | ICD-10-CM | POA: Diagnosis not present

## 2015-07-17 DIAGNOSIS — I10 Essential (primary) hypertension: Secondary | ICD-10-CM | POA: Diagnosis not present

## 2015-07-22 DIAGNOSIS — H5203 Hypermetropia, bilateral: Secondary | ICD-10-CM | POA: Diagnosis not present

## 2015-07-22 DIAGNOSIS — H25813 Combined forms of age-related cataract, bilateral: Secondary | ICD-10-CM | POA: Diagnosis not present

## 2015-07-22 DIAGNOSIS — H524 Presbyopia: Secondary | ICD-10-CM | POA: Diagnosis not present

## 2015-07-22 DIAGNOSIS — E113213 Type 2 diabetes mellitus with mild nonproliferative diabetic retinopathy with macular edema, bilateral: Secondary | ICD-10-CM | POA: Diagnosis not present

## 2015-07-22 DIAGNOSIS — H52223 Regular astigmatism, bilateral: Secondary | ICD-10-CM | POA: Diagnosis not present

## 2015-07-22 DIAGNOSIS — Z794 Long term (current) use of insulin: Secondary | ICD-10-CM | POA: Diagnosis not present

## 2015-07-22 DIAGNOSIS — E119 Type 2 diabetes mellitus without complications: Secondary | ICD-10-CM | POA: Diagnosis not present

## 2015-07-22 DIAGNOSIS — Z7984 Long term (current) use of oral hypoglycemic drugs: Secondary | ICD-10-CM | POA: Diagnosis not present

## 2015-07-22 DIAGNOSIS — I10 Essential (primary) hypertension: Secondary | ICD-10-CM | POA: Diagnosis not present

## 2015-08-28 DIAGNOSIS — H2512 Age-related nuclear cataract, left eye: Secondary | ICD-10-CM | POA: Diagnosis not present

## 2015-08-28 DIAGNOSIS — H40033 Anatomical narrow angle, bilateral: Secondary | ICD-10-CM | POA: Diagnosis not present

## 2015-08-31 ENCOUNTER — Encounter: Payer: Self-pay | Admitting: Podiatry

## 2015-08-31 ENCOUNTER — Ambulatory Visit (INDEPENDENT_AMBULATORY_CARE_PROVIDER_SITE_OTHER): Payer: Medicare Other | Admitting: Podiatry

## 2015-08-31 DIAGNOSIS — M79676 Pain in unspecified toe(s): Secondary | ICD-10-CM | POA: Diagnosis not present

## 2015-08-31 DIAGNOSIS — E1140 Type 2 diabetes mellitus with diabetic neuropathy, unspecified: Secondary | ICD-10-CM

## 2015-08-31 DIAGNOSIS — B351 Tinea unguium: Secondary | ICD-10-CM | POA: Diagnosis not present

## 2015-08-31 NOTE — Progress Notes (Signed)
Patient ID: [name], male   DOB: [birth year], 81 y.o.   MRN: [id number] Complaint:  Visit Type: Patient returns to my office for continued preventative foot care services. Complaint: Patient states" my nails have grown long and thick and become painful to walk and wear shoes" Patient has been diagnosed with DM with neuropathy. He presents for preventative foot care services. No changes to ROS  Podiatric Exam: Vascular: dorsalis pedis and posterior tibial pulses are palpable bilateral. Capillary return is immediate. Temperature gradient is WNL. Skin turgor WNL  Sensorium: Normal Semmes Weinstein monofilament test. Normal tactile sensation bilaterally. Nail Exam: Pt has thick disfigured discolored nails with subungual debris noted bilateral entire nail hallux through fifth toenails Ulcer Exam: There is no evidence of ulcer or pre-ulcerative changes or infection. Orthopedic Exam: Muscle tone and strength are WNL. No limitations in general ROM. No crepitus or effusions noted. Foot type and digits show no abnormalities. Bony prominences are unremarkable. Skin: No Porokeratosis. No infection or ulcers  Diagnosis:  Tinea unguium, Pain in right toe, pain in left toes  Treatment & Plan Procedures and Treatment: Consent by patient was obtained for treatment procedures. The patient understood the discussion of treatment and procedures well. All questions were answered thoroughly reviewed. Debridement of mycotic and hypertrophic toenails, 1 through 5 bilateral and clearing of subungual debris. No ulceration, no infection noted.  Return Visit-Office Procedure: Patient instructed to return to the office for a follow up visit 3 months for continued evaluation and treatment.   Gardiner Barefoot DPM

## 2015-09-07 DIAGNOSIS — E1142 Type 2 diabetes mellitus with diabetic polyneuropathy: Secondary | ICD-10-CM | POA: Diagnosis not present

## 2015-09-07 DIAGNOSIS — E785 Hyperlipidemia, unspecified: Secondary | ICD-10-CM | POA: Diagnosis not present

## 2015-09-07 DIAGNOSIS — N189 Chronic kidney disease, unspecified: Secondary | ICD-10-CM | POA: Diagnosis not present

## 2015-09-10 DIAGNOSIS — E1142 Type 2 diabetes mellitus with diabetic polyneuropathy: Secondary | ICD-10-CM | POA: Diagnosis not present

## 2015-09-10 DIAGNOSIS — N189 Chronic kidney disease, unspecified: Secondary | ICD-10-CM | POA: Diagnosis not present

## 2015-09-10 DIAGNOSIS — E1122 Type 2 diabetes mellitus with diabetic chronic kidney disease: Secondary | ICD-10-CM | POA: Diagnosis not present

## 2015-09-10 DIAGNOSIS — Z6836 Body mass index (BMI) 36.0-36.9, adult: Secondary | ICD-10-CM | POA: Diagnosis not present

## 2015-09-10 DIAGNOSIS — E785 Hyperlipidemia, unspecified: Secondary | ICD-10-CM | POA: Diagnosis not present

## 2015-09-10 DIAGNOSIS — I10 Essential (primary) hypertension: Secondary | ICD-10-CM | POA: Diagnosis not present

## 2015-09-22 DIAGNOSIS — H18232 Secondary corneal edema, left eye: Secondary | ICD-10-CM | POA: Diagnosis not present

## 2015-09-22 DIAGNOSIS — E119 Type 2 diabetes mellitus without complications: Secondary | ICD-10-CM | POA: Diagnosis not present

## 2015-09-22 DIAGNOSIS — Z79899 Other long term (current) drug therapy: Secondary | ICD-10-CM | POA: Diagnosis not present

## 2015-09-22 DIAGNOSIS — K219 Gastro-esophageal reflux disease without esophagitis: Secondary | ICD-10-CM | POA: Diagnosis not present

## 2015-09-22 DIAGNOSIS — Z961 Presence of intraocular lens: Secondary | ICD-10-CM | POA: Diagnosis not present

## 2015-09-22 DIAGNOSIS — E785 Hyperlipidemia, unspecified: Secondary | ICD-10-CM | POA: Diagnosis not present

## 2015-09-22 DIAGNOSIS — Z7984 Long term (current) use of oral hypoglycemic drugs: Secondary | ICD-10-CM | POA: Diagnosis not present

## 2015-09-22 DIAGNOSIS — H2512 Age-related nuclear cataract, left eye: Secondary | ICD-10-CM | POA: Diagnosis not present

## 2015-09-22 DIAGNOSIS — F17210 Nicotine dependence, cigarettes, uncomplicated: Secondary | ICD-10-CM | POA: Diagnosis not present

## 2015-09-22 DIAGNOSIS — H20012 Primary iridocyclitis, left eye: Secondary | ICD-10-CM | POA: Diagnosis not present

## 2015-09-22 DIAGNOSIS — G629 Polyneuropathy, unspecified: Secondary | ICD-10-CM | POA: Diagnosis not present

## 2015-09-22 DIAGNOSIS — E669 Obesity, unspecified: Secondary | ICD-10-CM | POA: Diagnosis not present

## 2015-09-22 DIAGNOSIS — Z9842 Cataract extraction status, left eye: Secondary | ICD-10-CM | POA: Diagnosis not present

## 2015-09-22 DIAGNOSIS — I10 Essential (primary) hypertension: Secondary | ICD-10-CM | POA: Diagnosis not present

## 2015-09-22 DIAGNOSIS — H25812 Combined forms of age-related cataract, left eye: Secondary | ICD-10-CM | POA: Diagnosis not present

## 2015-09-22 DIAGNOSIS — Z794 Long term (current) use of insulin: Secondary | ICD-10-CM | POA: Diagnosis not present

## 2015-10-03 DIAGNOSIS — J019 Acute sinusitis, unspecified: Secondary | ICD-10-CM | POA: Diagnosis not present

## 2015-10-03 DIAGNOSIS — J208 Acute bronchitis due to other specified organisms: Secondary | ICD-10-CM | POA: Diagnosis not present

## 2015-10-14 DIAGNOSIS — J019 Acute sinusitis, unspecified: Secondary | ICD-10-CM | POA: Diagnosis not present

## 2015-10-14 DIAGNOSIS — Z6835 Body mass index (BMI) 35.0-35.9, adult: Secondary | ICD-10-CM | POA: Diagnosis not present

## 2015-10-21 DIAGNOSIS — J208 Acute bronchitis due to other specified organisms: Secondary | ICD-10-CM | POA: Diagnosis not present

## 2015-10-21 DIAGNOSIS — Z6835 Body mass index (BMI) 35.0-35.9, adult: Secondary | ICD-10-CM | POA: Diagnosis not present

## 2015-11-02 IMAGING — CR DG CHEST 2V
2 series · 2 of 2 positions shown · non-contrast
Comparison: PA and lateral chest x-ray dated January 14, 2013.

CLINICAL DATA: Preoperative exam prior to lumbar laminectomy;
history of tobacco use, diabetes, and prostate malignancy.

EXAM:
CHEST  2 VIEW

[w chest pa]
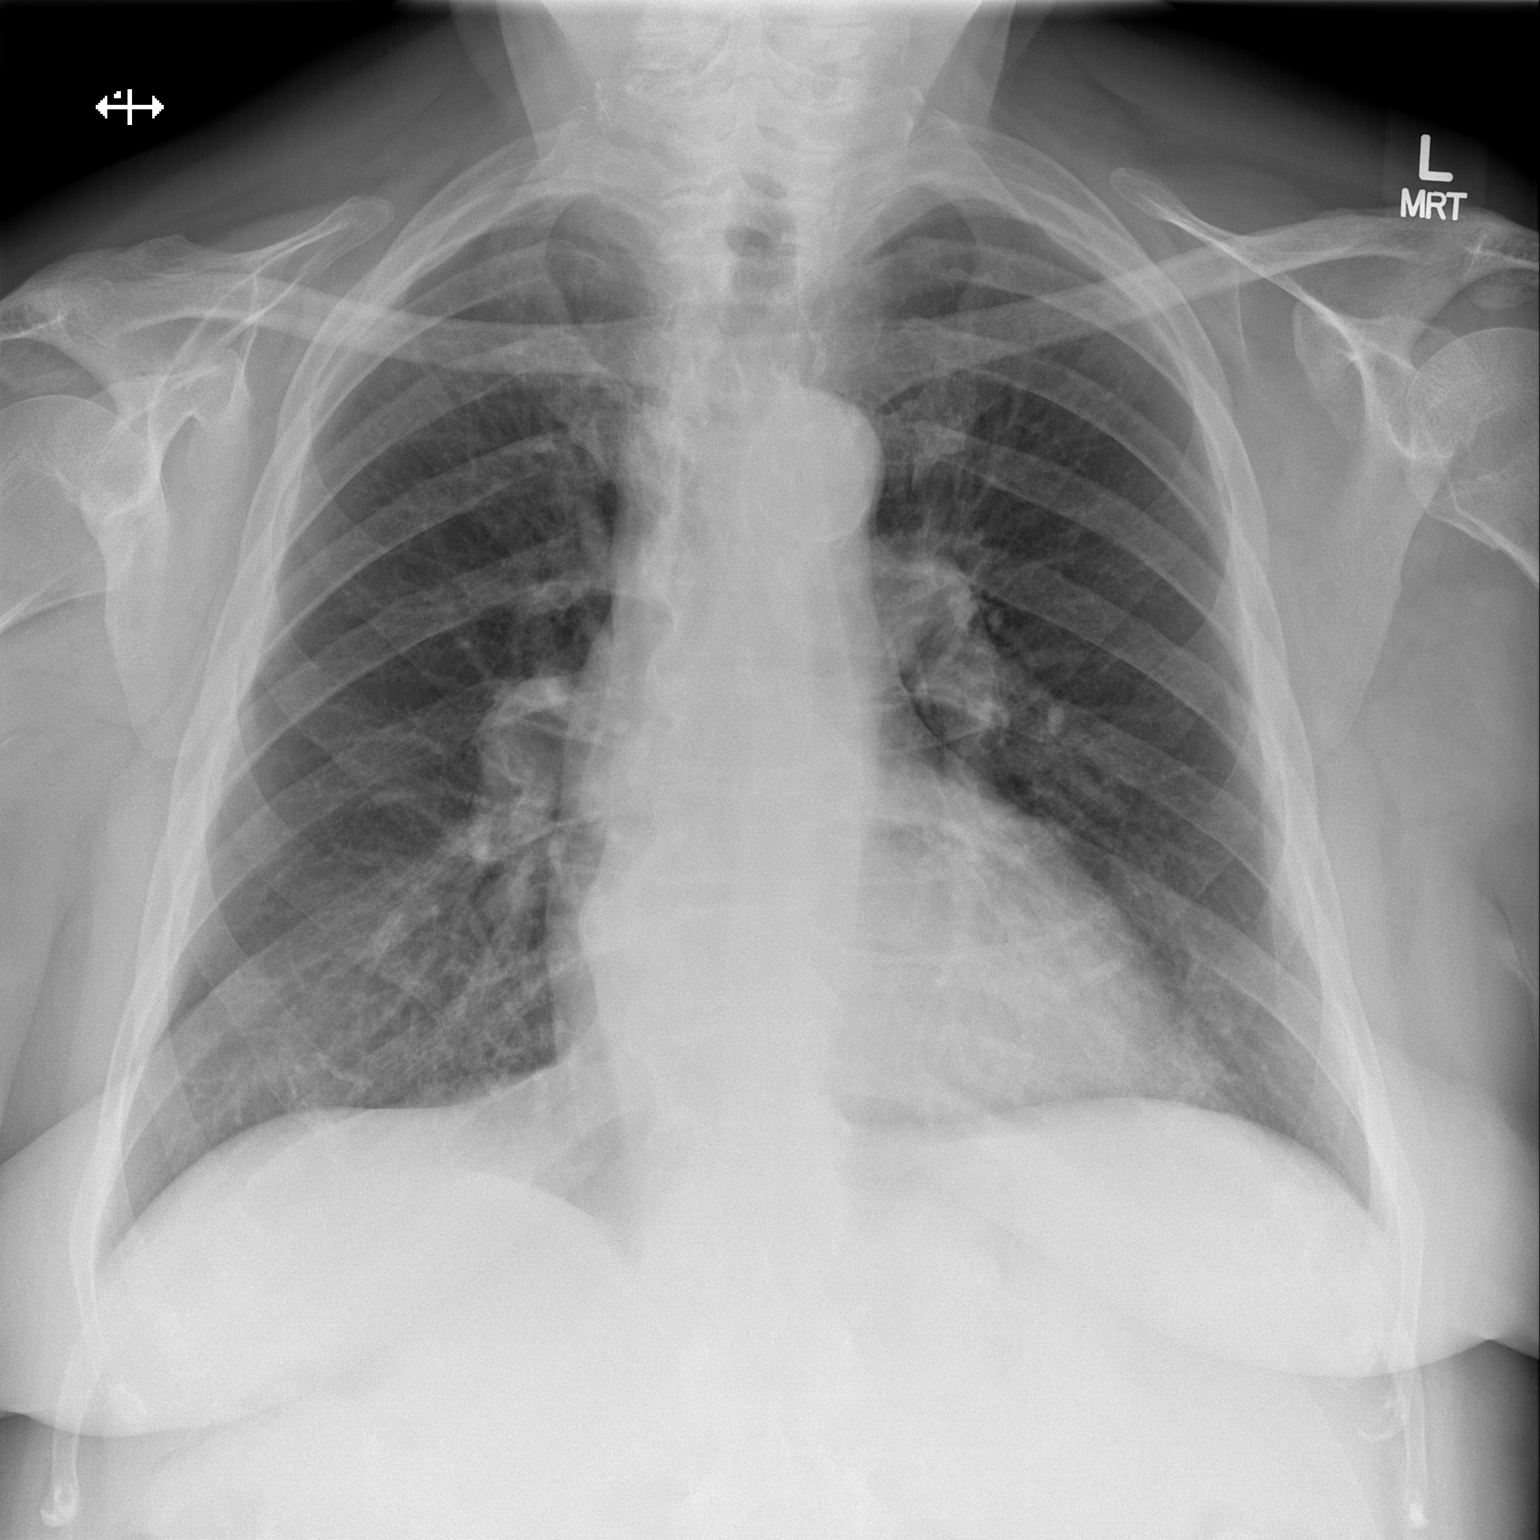

[w chest lat]
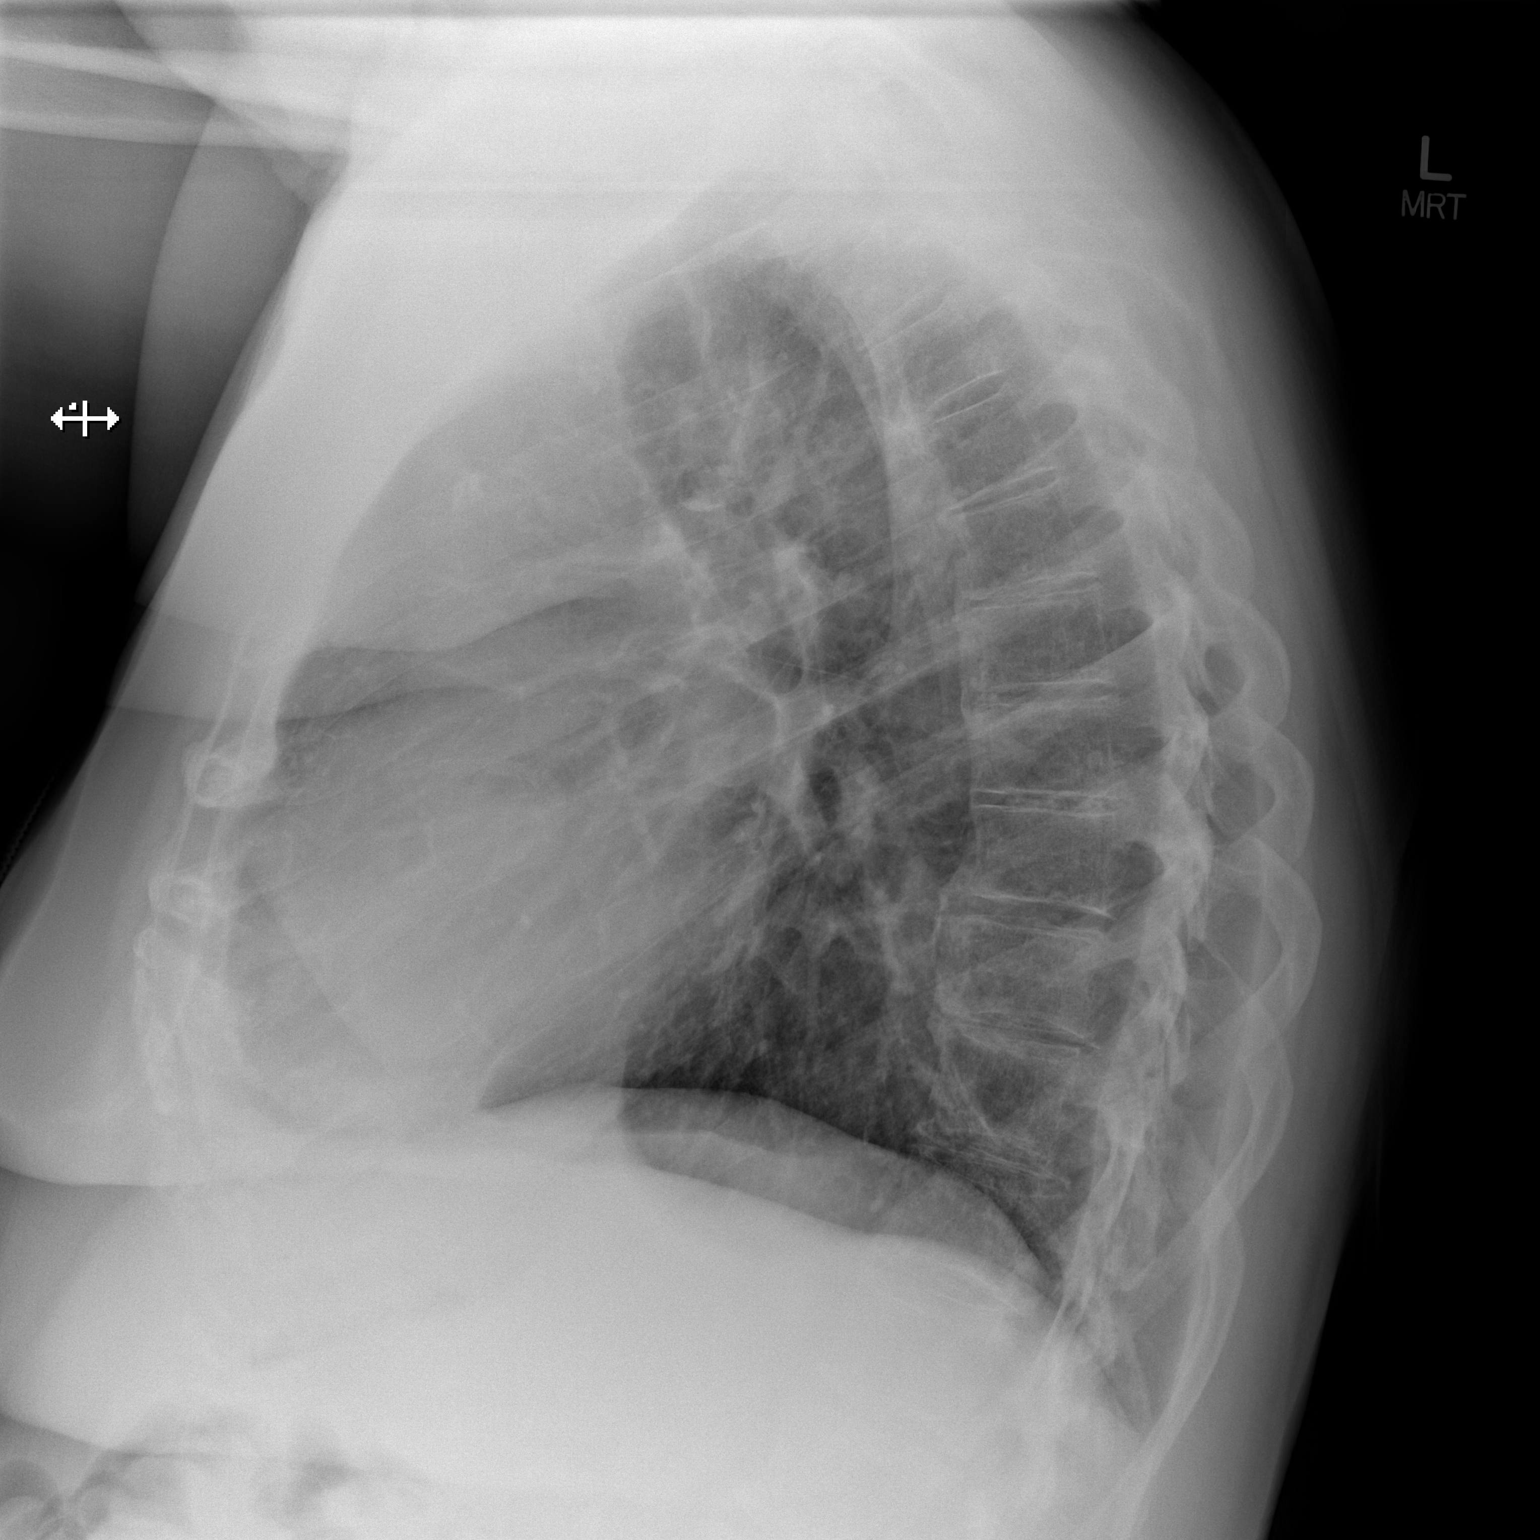

[2 of 2 positions shown; findings below may reference images not displayed]

FINDINGS: The lungs are mildly hyperinflated and clear. The heart and
pulmonary vascularity are normal. The mediastinum is normal in
width. There is no pleural effusion. The bony thorax is
unremarkable.
IMPRESSION: Mild hyperinflation consistent with COPD. There is no active
cardiopulmonary disease.

## 2015-11-03 DIAGNOSIS — H25811 Combined forms of age-related cataract, right eye: Secondary | ICD-10-CM | POA: Diagnosis not present

## 2015-11-03 DIAGNOSIS — Z79899 Other long term (current) drug therapy: Secondary | ICD-10-CM | POA: Diagnosis not present

## 2015-11-03 DIAGNOSIS — Z7984 Long term (current) use of oral hypoglycemic drugs: Secondary | ICD-10-CM | POA: Diagnosis not present

## 2015-11-03 DIAGNOSIS — Z9842 Cataract extraction status, left eye: Secondary | ICD-10-CM | POA: Diagnosis not present

## 2015-11-03 DIAGNOSIS — H2511 Age-related nuclear cataract, right eye: Secondary | ICD-10-CM | POA: Diagnosis not present

## 2015-11-03 DIAGNOSIS — Z794 Long term (current) use of insulin: Secondary | ICD-10-CM | POA: Diagnosis not present

## 2015-11-03 DIAGNOSIS — F17200 Nicotine dependence, unspecified, uncomplicated: Secondary | ICD-10-CM | POA: Diagnosis not present

## 2015-11-03 DIAGNOSIS — E119 Type 2 diabetes mellitus without complications: Secondary | ICD-10-CM | POA: Diagnosis not present

## 2015-11-03 DIAGNOSIS — Z961 Presence of intraocular lens: Secondary | ICD-10-CM | POA: Diagnosis not present

## 2015-11-03 DIAGNOSIS — K219 Gastro-esophageal reflux disease without esophagitis: Secondary | ICD-10-CM | POA: Diagnosis not present

## 2015-11-03 DIAGNOSIS — E785 Hyperlipidemia, unspecified: Secondary | ICD-10-CM | POA: Diagnosis not present

## 2015-11-03 DIAGNOSIS — Z9841 Cataract extraction status, right eye: Secondary | ICD-10-CM | POA: Diagnosis not present

## 2015-11-03 DIAGNOSIS — I10 Essential (primary) hypertension: Secondary | ICD-10-CM | POA: Diagnosis not present

## 2015-11-24 DIAGNOSIS — L821 Other seborrheic keratosis: Secondary | ICD-10-CM | POA: Diagnosis not present

## 2015-11-24 DIAGNOSIS — C44229 Squamous cell carcinoma of skin of left ear and external auricular canal: Secondary | ICD-10-CM | POA: Diagnosis not present

## 2015-11-24 DIAGNOSIS — L578 Other skin changes due to chronic exposure to nonionizing radiation: Secondary | ICD-10-CM | POA: Diagnosis not present

## 2015-11-30 ENCOUNTER — Encounter: Payer: Self-pay | Admitting: Podiatry

## 2015-11-30 ENCOUNTER — Ambulatory Visit (INDEPENDENT_AMBULATORY_CARE_PROVIDER_SITE_OTHER): Payer: Medicare Other | Admitting: Podiatry

## 2015-11-30 DIAGNOSIS — M79676 Pain in unspecified toe(s): Secondary | ICD-10-CM

## 2015-11-30 DIAGNOSIS — B351 Tinea unguium: Secondary | ICD-10-CM

## 2015-11-30 DIAGNOSIS — E1140 Type 2 diabetes mellitus with diabetic neuropathy, unspecified: Secondary | ICD-10-CM

## 2015-11-30 NOTE — Progress Notes (Signed)
Patient ID: [name], male   DOB: [birth year], 81 y.o.   MRN: [id number] Complaint:  Visit Type: Patient returns to my office for continued preventative foot care services. Complaint: Patient states" my nails have grown long and thick and become painful to walk and wear shoes" Patient has been diagnosed with DM with neuropathy. He presents for preventative foot care services. No changes to ROS  Podiatric Exam: Vascular: dorsalis pedis and posterior tibial pulses are palpable bilateral. Capillary return is immediate. Temperature gradient is WNL. Skin turgor WNL  Sensorium: Normal Semmes Weinstein monofilament test. Normal tactile sensation bilaterally. Nail Exam: Pt has thick disfigured discolored nails with subungual debris noted bilateral entire nail hallux through fifth toenails Ulcer Exam: There is no evidence of ulcer or pre-ulcerative changes or infection. Orthopedic Exam: Muscle tone and strength are WNL. No limitations in general ROM. No crepitus or effusions noted. Foot type and digits show no abnormalities. Bony prominences are unremarkable. Skin: No Porokeratosis. No infection or ulcers  Diagnosis:  Tinea unguium, Pain in right toe, pain in left toes  Treatment & Plan Procedures and Treatment: Consent by patient was obtained for treatment procedures. The patient understood the discussion of treatment and procedures well. All questions were answered thoroughly reviewed. Debridement of mycotic and hypertrophic toenails, 1 through 5 bilateral and clearing of subungual debris. No ulceration, no infection noted.  Return Visit-Office Procedure: Patient instructed to return to the office for a follow up visit 3 months for continued evaluation and treatment.   Gardiner Barefoot DPM

## 2015-12-08 DIAGNOSIS — E1142 Type 2 diabetes mellitus with diabetic polyneuropathy: Secondary | ICD-10-CM | POA: Diagnosis not present

## 2015-12-08 DIAGNOSIS — D649 Anemia, unspecified: Secondary | ICD-10-CM | POA: Diagnosis not present

## 2015-12-08 DIAGNOSIS — E785 Hyperlipidemia, unspecified: Secondary | ICD-10-CM | POA: Diagnosis not present

## 2015-12-09 DIAGNOSIS — E1142 Type 2 diabetes mellitus with diabetic polyneuropathy: Secondary | ICD-10-CM | POA: Diagnosis not present

## 2015-12-11 DIAGNOSIS — Z1389 Encounter for screening for other disorder: Secondary | ICD-10-CM | POA: Diagnosis not present

## 2015-12-11 DIAGNOSIS — N189 Chronic kidney disease, unspecified: Secondary | ICD-10-CM | POA: Diagnosis not present

## 2015-12-11 DIAGNOSIS — E785 Hyperlipidemia, unspecified: Secondary | ICD-10-CM | POA: Diagnosis not present

## 2015-12-11 DIAGNOSIS — N183 Chronic kidney disease, stage 3 (moderate): Secondary | ICD-10-CM | POA: Diagnosis not present

## 2015-12-11 DIAGNOSIS — I10 Essential (primary) hypertension: Secondary | ICD-10-CM | POA: Diagnosis not present

## 2015-12-11 DIAGNOSIS — E1142 Type 2 diabetes mellitus with diabetic polyneuropathy: Secondary | ICD-10-CM | POA: Diagnosis not present

## 2015-12-11 DIAGNOSIS — E669 Obesity, unspecified: Secondary | ICD-10-CM | POA: Diagnosis not present

## 2015-12-11 DIAGNOSIS — Z6836 Body mass index (BMI) 36.0-36.9, adult: Secondary | ICD-10-CM | POA: Diagnosis not present

## 2016-01-05 DIAGNOSIS — Z6836 Body mass index (BMI) 36.0-36.9, adult: Secondary | ICD-10-CM | POA: Diagnosis not present

## 2016-01-05 DIAGNOSIS — M25562 Pain in left knee: Secondary | ICD-10-CM | POA: Diagnosis not present

## 2016-01-11 DIAGNOSIS — M1712 Unilateral primary osteoarthritis, left knee: Secondary | ICD-10-CM | POA: Diagnosis not present

## 2016-01-20 DIAGNOSIS — H353131 Nonexudative age-related macular degeneration, bilateral, early dry stage: Secondary | ICD-10-CM | POA: Diagnosis not present

## 2016-01-20 DIAGNOSIS — I10 Essential (primary) hypertension: Secondary | ICD-10-CM | POA: Diagnosis not present

## 2016-01-25 DIAGNOSIS — I872 Venous insufficiency (chronic) (peripheral): Secondary | ICD-10-CM | POA: Diagnosis not present

## 2016-01-25 DIAGNOSIS — Z6836 Body mass index (BMI) 36.0-36.9, adult: Secondary | ICD-10-CM | POA: Diagnosis not present

## 2016-01-25 DIAGNOSIS — R600 Localized edema: Secondary | ICD-10-CM | POA: Diagnosis not present

## 2016-02-03 DIAGNOSIS — H52222 Regular astigmatism, left eye: Secondary | ICD-10-CM | POA: Diagnosis not present

## 2016-02-03 DIAGNOSIS — I10 Essential (primary) hypertension: Secondary | ICD-10-CM | POA: Diagnosis not present

## 2016-02-03 DIAGNOSIS — H5213 Myopia, bilateral: Secondary | ICD-10-CM | POA: Diagnosis not present

## 2016-02-03 DIAGNOSIS — H524 Presbyopia: Secondary | ICD-10-CM | POA: Diagnosis not present

## 2016-02-03 DIAGNOSIS — H0231 Blepharochalasis right upper eyelid: Secondary | ICD-10-CM | POA: Diagnosis not present

## 2016-02-03 DIAGNOSIS — H0234 Blepharochalasis left upper eyelid: Secondary | ICD-10-CM | POA: Diagnosis not present

## 2016-02-15 DIAGNOSIS — M1712 Unilateral primary osteoarthritis, left knee: Secondary | ICD-10-CM | POA: Diagnosis not present

## 2016-02-18 DIAGNOSIS — R262 Difficulty in walking, not elsewhere classified: Secondary | ICD-10-CM | POA: Diagnosis not present

## 2016-02-18 DIAGNOSIS — M25562 Pain in left knee: Secondary | ICD-10-CM | POA: Diagnosis not present

## 2016-02-18 DIAGNOSIS — M1712 Unilateral primary osteoarthritis, left knee: Secondary | ICD-10-CM | POA: Diagnosis not present

## 2016-02-18 DIAGNOSIS — M25662 Stiffness of left knee, not elsewhere classified: Secondary | ICD-10-CM | POA: Diagnosis not present

## 2016-02-22 DIAGNOSIS — H02403 Unspecified ptosis of bilateral eyelids: Secondary | ICD-10-CM | POA: Diagnosis not present

## 2016-02-23 DIAGNOSIS — I872 Venous insufficiency (chronic) (peripheral): Secondary | ICD-10-CM | POA: Diagnosis not present

## 2016-02-23 DIAGNOSIS — M25662 Stiffness of left knee, not elsewhere classified: Secondary | ICD-10-CM | POA: Diagnosis not present

## 2016-02-23 DIAGNOSIS — C61 Malignant neoplasm of prostate: Secondary | ICD-10-CM | POA: Diagnosis not present

## 2016-02-23 DIAGNOSIS — E1140 Type 2 diabetes mellitus with diabetic neuropathy, unspecified: Secondary | ICD-10-CM | POA: Diagnosis not present

## 2016-02-23 DIAGNOSIS — Z6836 Body mass index (BMI) 36.0-36.9, adult: Secondary | ICD-10-CM | POA: Diagnosis not present

## 2016-02-23 DIAGNOSIS — I10 Essential (primary) hypertension: Secondary | ICD-10-CM | POA: Diagnosis not present

## 2016-02-23 DIAGNOSIS — R600 Localized edema: Secondary | ICD-10-CM | POA: Diagnosis not present

## 2016-02-23 DIAGNOSIS — R262 Difficulty in walking, not elsewhere classified: Secondary | ICD-10-CM | POA: Diagnosis not present

## 2016-02-23 DIAGNOSIS — M25562 Pain in left knee: Secondary | ICD-10-CM | POA: Diagnosis not present

## 2016-02-23 DIAGNOSIS — M1712 Unilateral primary osteoarthritis, left knee: Secondary | ICD-10-CM | POA: Diagnosis not present

## 2016-02-26 DIAGNOSIS — R262 Difficulty in walking, not elsewhere classified: Secondary | ICD-10-CM | POA: Diagnosis not present

## 2016-02-26 DIAGNOSIS — M25562 Pain in left knee: Secondary | ICD-10-CM | POA: Diagnosis not present

## 2016-02-26 DIAGNOSIS — M25662 Stiffness of left knee, not elsewhere classified: Secondary | ICD-10-CM | POA: Diagnosis not present

## 2016-02-26 DIAGNOSIS — M1712 Unilateral primary osteoarthritis, left knee: Secondary | ICD-10-CM | POA: Diagnosis not present

## 2016-02-29 ENCOUNTER — Encounter: Payer: Self-pay | Admitting: Podiatry

## 2016-02-29 ENCOUNTER — Ambulatory Visit (INDEPENDENT_AMBULATORY_CARE_PROVIDER_SITE_OTHER): Payer: Medicare Other | Admitting: Podiatry

## 2016-02-29 DIAGNOSIS — B351 Tinea unguium: Secondary | ICD-10-CM

## 2016-02-29 DIAGNOSIS — E1149 Type 2 diabetes mellitus with other diabetic neurological complication: Secondary | ICD-10-CM

## 2016-02-29 DIAGNOSIS — M79676 Pain in unspecified toe(s): Secondary | ICD-10-CM

## 2016-02-29 NOTE — Progress Notes (Signed)
Patient ID: [name], male   DOB: 1934-10-30, 81 y.o.   MRN: [id number] Complaint:  Visit Type: Patient returns to my office for continued preventative foot care services. Complaint: Patient states" my nails have grown long and thick and become painful to walk and wear shoes" Patient has been diagnosed with DM with neuropathy. He presents for preventative foot care services. No changes to ROS  Podiatric Exam: Vascular: dorsalis pedis and posterior tibial pulses are palpable bilateral. Capillary return is immediate. Temperature gradient is WNL. Skin turgor WNL  Sensorium: Normal Semmes Weinstein monofilament test. Normal tactile sensation bilaterally. Nail Exam: Pt has thick disfigured discolored nails with subungual debris noted bilateral entire nail hallux through fifth toenails Ulcer Exam: There is no evidence of ulcer or pre-ulcerative changes or infection. Orthopedic Exam: Muscle tone and strength are WNL. No limitations in general ROM. No crepitus or effusions noted. Foot type and digits show no abnormalities. Bony prominences are unremarkable. Skin: No Porokeratosis. No infection or ulcers  Diagnosis:  Tinea unguium, Pain in right toe, pain in left toes  Treatment & Plan Procedures and Treatment: Consent by patient was obtained for treatment procedures. The patient understood the discussion of treatment and procedures well. All questions were answered thoroughly reviewed. Debridement of mycotic and hypertrophic toenails, 1 through 5 bilateral and clearing of subungual debris. No ulceration, no infection noted.  Return Visit-Office Procedure: Patient instructed to return to the office for a follow up visit 3 months for continued evaluation and treatment.   Gardiner Barefoot DPM

## 2016-03-01 DIAGNOSIS — N200 Calculus of kidney: Secondary | ICD-10-CM | POA: Diagnosis not present

## 2016-03-01 DIAGNOSIS — C61 Malignant neoplasm of prostate: Secondary | ICD-10-CM | POA: Diagnosis not present

## 2016-03-02 DIAGNOSIS — M1712 Unilateral primary osteoarthritis, left knee: Secondary | ICD-10-CM | POA: Diagnosis not present

## 2016-03-02 DIAGNOSIS — M25662 Stiffness of left knee, not elsewhere classified: Secondary | ICD-10-CM | POA: Diagnosis not present

## 2016-03-02 DIAGNOSIS — R262 Difficulty in walking, not elsewhere classified: Secondary | ICD-10-CM | POA: Diagnosis not present

## 2016-03-02 DIAGNOSIS — M25562 Pain in left knee: Secondary | ICD-10-CM | POA: Diagnosis not present

## 2016-03-04 DIAGNOSIS — M1712 Unilateral primary osteoarthritis, left knee: Secondary | ICD-10-CM | POA: Diagnosis not present

## 2016-03-04 DIAGNOSIS — M25662 Stiffness of left knee, not elsewhere classified: Secondary | ICD-10-CM | POA: Diagnosis not present

## 2016-03-04 DIAGNOSIS — M25562 Pain in left knee: Secondary | ICD-10-CM | POA: Diagnosis not present

## 2016-03-04 DIAGNOSIS — R262 Difficulty in walking, not elsewhere classified: Secondary | ICD-10-CM | POA: Diagnosis not present

## 2016-03-09 DIAGNOSIS — M25662 Stiffness of left knee, not elsewhere classified: Secondary | ICD-10-CM | POA: Diagnosis not present

## 2016-03-09 DIAGNOSIS — M25562 Pain in left knee: Secondary | ICD-10-CM | POA: Diagnosis not present

## 2016-03-09 DIAGNOSIS — R262 Difficulty in walking, not elsewhere classified: Secondary | ICD-10-CM | POA: Diagnosis not present

## 2016-03-09 DIAGNOSIS — M1712 Unilateral primary osteoarthritis, left knee: Secondary | ICD-10-CM | POA: Diagnosis not present

## 2016-03-16 DIAGNOSIS — M25562 Pain in left knee: Secondary | ICD-10-CM | POA: Diagnosis not present

## 2016-03-16 DIAGNOSIS — R262 Difficulty in walking, not elsewhere classified: Secondary | ICD-10-CM | POA: Diagnosis not present

## 2016-03-16 DIAGNOSIS — M1712 Unilateral primary osteoarthritis, left knee: Secondary | ICD-10-CM | POA: Diagnosis not present

## 2016-03-16 DIAGNOSIS — M25662 Stiffness of left knee, not elsewhere classified: Secondary | ICD-10-CM | POA: Diagnosis not present

## 2016-03-23 DIAGNOSIS — E785 Hyperlipidemia, unspecified: Secondary | ICD-10-CM | POA: Diagnosis not present

## 2016-03-23 DIAGNOSIS — D649 Anemia, unspecified: Secondary | ICD-10-CM | POA: Diagnosis not present

## 2016-03-23 DIAGNOSIS — E1142 Type 2 diabetes mellitus with diabetic polyneuropathy: Secondary | ICD-10-CM | POA: Diagnosis not present

## 2016-03-24 DIAGNOSIS — N183 Chronic kidney disease, stage 3 (moderate): Secondary | ICD-10-CM | POA: Diagnosis not present

## 2016-03-24 DIAGNOSIS — R600 Localized edema: Secondary | ICD-10-CM | POA: Diagnosis not present

## 2016-03-24 DIAGNOSIS — E1142 Type 2 diabetes mellitus with diabetic polyneuropathy: Secondary | ICD-10-CM | POA: Diagnosis not present

## 2016-03-24 DIAGNOSIS — M170 Bilateral primary osteoarthritis of knee: Secondary | ICD-10-CM | POA: Diagnosis not present

## 2016-03-24 DIAGNOSIS — R262 Difficulty in walking, not elsewhere classified: Secondary | ICD-10-CM | POA: Diagnosis not present

## 2016-03-24 DIAGNOSIS — M25562 Pain in left knee: Secondary | ICD-10-CM | POA: Diagnosis not present

## 2016-03-24 DIAGNOSIS — E785 Hyperlipidemia, unspecified: Secondary | ICD-10-CM | POA: Diagnosis not present

## 2016-03-24 DIAGNOSIS — M25561 Pain in right knee: Secondary | ICD-10-CM | POA: Diagnosis not present

## 2016-03-24 DIAGNOSIS — Z6836 Body mass index (BMI) 36.0-36.9, adult: Secondary | ICD-10-CM | POA: Diagnosis not present

## 2016-03-24 DIAGNOSIS — I129 Hypertensive chronic kidney disease with stage 1 through stage 4 chronic kidney disease, or unspecified chronic kidney disease: Secondary | ICD-10-CM | POA: Diagnosis not present

## 2016-03-24 DIAGNOSIS — D649 Anemia, unspecified: Secondary | ICD-10-CM | POA: Diagnosis not present

## 2016-03-29 DIAGNOSIS — M25562 Pain in left knee: Secondary | ICD-10-CM | POA: Diagnosis not present

## 2016-03-29 DIAGNOSIS — M1712 Unilateral primary osteoarthritis, left knee: Secondary | ICD-10-CM | POA: Diagnosis not present

## 2016-03-31 DIAGNOSIS — M1711 Unilateral primary osteoarthritis, right knee: Secondary | ICD-10-CM | POA: Diagnosis not present

## 2016-03-31 DIAGNOSIS — M25561 Pain in right knee: Secondary | ICD-10-CM | POA: Diagnosis not present

## 2016-04-05 DIAGNOSIS — M1712 Unilateral primary osteoarthritis, left knee: Secondary | ICD-10-CM | POA: Diagnosis not present

## 2016-04-05 DIAGNOSIS — M25562 Pain in left knee: Secondary | ICD-10-CM | POA: Diagnosis not present

## 2016-04-07 DIAGNOSIS — M25561 Pain in right knee: Secondary | ICD-10-CM | POA: Diagnosis not present

## 2016-04-07 DIAGNOSIS — M1711 Unilateral primary osteoarthritis, right knee: Secondary | ICD-10-CM | POA: Diagnosis not present

## 2016-04-18 DIAGNOSIS — M25562 Pain in left knee: Secondary | ICD-10-CM | POA: Diagnosis not present

## 2016-04-18 DIAGNOSIS — M1712 Unilateral primary osteoarthritis, left knee: Secondary | ICD-10-CM | POA: Diagnosis not present

## 2016-04-20 DIAGNOSIS — M25561 Pain in right knee: Secondary | ICD-10-CM | POA: Diagnosis not present

## 2016-04-20 DIAGNOSIS — M1711 Unilateral primary osteoarthritis, right knee: Secondary | ICD-10-CM | POA: Diagnosis not present

## 2016-04-22 DIAGNOSIS — E1140 Type 2 diabetes mellitus with diabetic neuropathy, unspecified: Secondary | ICD-10-CM | POA: Diagnosis not present

## 2016-04-22 DIAGNOSIS — J019 Acute sinusitis, unspecified: Secondary | ICD-10-CM | POA: Diagnosis not present

## 2016-04-22 DIAGNOSIS — Z6835 Body mass index (BMI) 35.0-35.9, adult: Secondary | ICD-10-CM | POA: Diagnosis not present

## 2016-04-26 DIAGNOSIS — M25562 Pain in left knee: Secondary | ICD-10-CM | POA: Diagnosis not present

## 2016-04-26 DIAGNOSIS — M170 Bilateral primary osteoarthritis of knee: Secondary | ICD-10-CM | POA: Diagnosis not present

## 2016-04-26 DIAGNOSIS — M25561 Pain in right knee: Secondary | ICD-10-CM | POA: Diagnosis not present

## 2016-05-23 DIAGNOSIS — H02834 Dermatochalasis of left upper eyelid: Secondary | ICD-10-CM | POA: Diagnosis not present

## 2016-05-23 DIAGNOSIS — H02401 Unspecified ptosis of right eyelid: Secondary | ICD-10-CM | POA: Diagnosis not present

## 2016-05-23 DIAGNOSIS — H02402 Unspecified ptosis of left eyelid: Secondary | ICD-10-CM | POA: Diagnosis not present

## 2016-05-23 DIAGNOSIS — H02831 Dermatochalasis of right upper eyelid: Secondary | ICD-10-CM | POA: Diagnosis not present

## 2016-05-23 DIAGNOSIS — H02403 Unspecified ptosis of bilateral eyelids: Secondary | ICD-10-CM | POA: Diagnosis not present

## 2016-05-23 DIAGNOSIS — E119 Type 2 diabetes mellitus without complications: Secondary | ICD-10-CM | POA: Diagnosis not present

## 2016-06-03 DIAGNOSIS — Z23 Encounter for immunization: Secondary | ICD-10-CM | POA: Diagnosis not present

## 2016-06-06 ENCOUNTER — Ambulatory Visit (INDEPENDENT_AMBULATORY_CARE_PROVIDER_SITE_OTHER): Payer: Medicare Other | Admitting: Podiatry

## 2016-06-06 ENCOUNTER — Encounter: Payer: Self-pay | Admitting: Podiatry

## 2016-06-06 VITALS — BP 126/61 | HR 69 | Resp 16 | Ht 72.0 in | Wt 254.0 lb

## 2016-06-06 DIAGNOSIS — E1140 Type 2 diabetes mellitus with diabetic neuropathy, unspecified: Secondary | ICD-10-CM

## 2016-06-06 DIAGNOSIS — B351 Tinea unguium: Secondary | ICD-10-CM | POA: Diagnosis not present

## 2016-06-06 DIAGNOSIS — M79676 Pain in unspecified toe(s): Secondary | ICD-10-CM

## 2016-06-06 NOTE — Progress Notes (Signed)
Patient ID: [name], male   DOB: [birth year], 81 y.o.   MRN: [id number] Complaint:  Visit Type: Patient returns to my office for continued preventative foot care services. Complaint: Patient states" my nails have grown long and thick and become painful to walk and wear shoes" Patient has been diagnosed with DM with neuropathy. He presents for preventative foot care services. No changes to ROS  Podiatric Exam: Vascular: dorsalis pedis and posterior tibial pulses are palpable bilateral. Capillary return is immediate. Temperature gradient is WNL. Skin turgor WNL  Sensorium: Normal Semmes Weinstein monofilament test. Normal tactile sensation bilaterally. Nail Exam: Pt has thick disfigured discolored nails with subungual debris noted bilateral entire nail hallux through fifth toenails Ulcer Exam: There is no evidence of ulcer or pre-ulcerative changes or infection. Orthopedic Exam: Muscle tone and strength are WNL. No limitations in general ROM. No crepitus or effusions noted. Foot type and digits show no abnormalities. Bony prominences are unremarkable. Skin: No Porokeratosis. No infection or ulcers  Diagnosis:  Tinea unguium, Pain in right toe, pain in left toes  Treatment & Plan Procedures and Treatment: Consent by patient was obtained for treatment procedures. The patient understood the discussion of treatment and procedures well. All questions were answered thoroughly reviewed. Debridement of mycotic and hypertrophic toenails, 1 through 5 bilateral and clearing of subungual debris. No ulceration, no infection noted.  Return Visit-Office Procedure: Patient instructed to return to the office for a follow up visit 3 months for continued evaluation and treatment.   Gardiner Barefoot DPM

## 2016-06-27 DIAGNOSIS — E785 Hyperlipidemia, unspecified: Secondary | ICD-10-CM | POA: Diagnosis not present

## 2016-06-27 DIAGNOSIS — E1142 Type 2 diabetes mellitus with diabetic polyneuropathy: Secondary | ICD-10-CM | POA: Diagnosis not present

## 2016-06-27 DIAGNOSIS — J019 Acute sinusitis, unspecified: Secondary | ICD-10-CM | POA: Diagnosis not present

## 2016-06-27 DIAGNOSIS — R600 Localized edema: Secondary | ICD-10-CM | POA: Diagnosis not present

## 2016-06-27 DIAGNOSIS — D638 Anemia in other chronic diseases classified elsewhere: Secondary | ICD-10-CM | POA: Diagnosis not present

## 2016-06-27 DIAGNOSIS — I10 Essential (primary) hypertension: Secondary | ICD-10-CM | POA: Diagnosis not present

## 2016-06-27 DIAGNOSIS — J208 Acute bronchitis due to other specified organisms: Secondary | ICD-10-CM | POA: Diagnosis not present

## 2016-06-27 DIAGNOSIS — N183 Chronic kidney disease, stage 3 (moderate): Secondary | ICD-10-CM | POA: Diagnosis not present

## 2016-07-21 DIAGNOSIS — M47816 Spondylosis without myelopathy or radiculopathy, lumbar region: Secondary | ICD-10-CM | POA: Diagnosis not present

## 2016-07-21 DIAGNOSIS — M4316 Spondylolisthesis, lumbar region: Secondary | ICD-10-CM | POA: Diagnosis not present

## 2016-07-21 DIAGNOSIS — M419 Scoliosis, unspecified: Secondary | ICD-10-CM | POA: Diagnosis not present

## 2016-07-21 DIAGNOSIS — M549 Dorsalgia, unspecified: Secondary | ICD-10-CM | POA: Diagnosis not present

## 2016-07-26 DIAGNOSIS — M47816 Spondylosis without myelopathy or radiculopathy, lumbar region: Secondary | ICD-10-CM | POA: Diagnosis not present

## 2016-08-15 DIAGNOSIS — M25562 Pain in left knee: Secondary | ICD-10-CM | POA: Diagnosis not present

## 2016-08-15 DIAGNOSIS — M25462 Effusion, left knee: Secondary | ICD-10-CM | POA: Diagnosis not present

## 2016-08-15 DIAGNOSIS — M1712 Unilateral primary osteoarthritis, left knee: Secondary | ICD-10-CM | POA: Diagnosis not present

## 2016-08-15 DIAGNOSIS — M170 Bilateral primary osteoarthritis of knee: Secondary | ICD-10-CM | POA: Diagnosis not present

## 2016-08-15 DIAGNOSIS — M25561 Pain in right knee: Secondary | ICD-10-CM | POA: Diagnosis not present

## 2016-08-16 DIAGNOSIS — M47816 Spondylosis without myelopathy or radiculopathy, lumbar region: Secondary | ICD-10-CM | POA: Diagnosis not present

## 2016-08-29 DIAGNOSIS — Z6835 Body mass index (BMI) 35.0-35.9, adult: Secondary | ICD-10-CM | POA: Diagnosis not present

## 2016-08-29 DIAGNOSIS — R220 Localized swelling, mass and lump, head: Secondary | ICD-10-CM | POA: Diagnosis not present

## 2016-08-30 DIAGNOSIS — H26491 Other secondary cataract, right eye: Secondary | ICD-10-CM | POA: Diagnosis not present

## 2016-08-30 DIAGNOSIS — R221 Localized swelling, mass and lump, neck: Secondary | ICD-10-CM | POA: Diagnosis not present

## 2016-08-30 DIAGNOSIS — H6123 Impacted cerumen, bilateral: Secondary | ICD-10-CM | POA: Diagnosis not present

## 2016-08-30 DIAGNOSIS — H353131 Nonexudative age-related macular degeneration, bilateral, early dry stage: Secondary | ICD-10-CM | POA: Diagnosis not present

## 2016-08-30 DIAGNOSIS — Z794 Long term (current) use of insulin: Secondary | ICD-10-CM | POA: Diagnosis not present

## 2016-08-30 DIAGNOSIS — K119 Disease of salivary gland, unspecified: Secondary | ICD-10-CM | POA: Diagnosis not present

## 2016-08-30 DIAGNOSIS — H52223 Regular astigmatism, bilateral: Secondary | ICD-10-CM | POA: Diagnosis not present

## 2016-08-30 DIAGNOSIS — H5201 Hypermetropia, right eye: Secondary | ICD-10-CM | POA: Diagnosis not present

## 2016-08-30 DIAGNOSIS — H524 Presbyopia: Secondary | ICD-10-CM | POA: Diagnosis not present

## 2016-08-30 DIAGNOSIS — E119 Type 2 diabetes mellitus without complications: Secondary | ICD-10-CM | POA: Diagnosis not present

## 2016-08-30 DIAGNOSIS — H5212 Myopia, left eye: Secondary | ICD-10-CM | POA: Diagnosis not present

## 2016-08-30 DIAGNOSIS — I10 Essential (primary) hypertension: Secondary | ICD-10-CM | POA: Diagnosis not present

## 2016-08-30 DIAGNOSIS — Z961 Presence of intraocular lens: Secondary | ICD-10-CM | POA: Diagnosis not present

## 2016-08-30 DIAGNOSIS — H26492 Other secondary cataract, left eye: Secondary | ICD-10-CM | POA: Diagnosis not present

## 2016-09-01 DIAGNOSIS — Z87891 Personal history of nicotine dependence: Secondary | ICD-10-CM | POA: Diagnosis not present

## 2016-09-01 DIAGNOSIS — Z01812 Encounter for preprocedural laboratory examination: Secondary | ICD-10-CM | POA: Diagnosis not present

## 2016-09-01 DIAGNOSIS — Z794 Long term (current) use of insulin: Secondary | ICD-10-CM | POA: Diagnosis not present

## 2016-09-01 DIAGNOSIS — I10 Essential (primary) hypertension: Secondary | ICD-10-CM | POA: Diagnosis not present

## 2016-09-01 DIAGNOSIS — Z79899 Other long term (current) drug therapy: Secondary | ICD-10-CM | POA: Diagnosis not present

## 2016-09-01 DIAGNOSIS — Z9889 Other specified postprocedural states: Secondary | ICD-10-CM | POA: Diagnosis not present

## 2016-09-01 DIAGNOSIS — E119 Type 2 diabetes mellitus without complications: Secondary | ICD-10-CM | POA: Diagnosis not present

## 2016-09-05 DIAGNOSIS — C61 Malignant neoplasm of prostate: Secondary | ICD-10-CM | POA: Diagnosis not present

## 2016-09-05 DIAGNOSIS — F17210 Nicotine dependence, cigarettes, uncomplicated: Secondary | ICD-10-CM | POA: Diagnosis not present

## 2016-09-05 DIAGNOSIS — E1122 Type 2 diabetes mellitus with diabetic chronic kidney disease: Secondary | ICD-10-CM | POA: Diagnosis not present

## 2016-09-05 DIAGNOSIS — E118 Type 2 diabetes mellitus with unspecified complications: Secondary | ICD-10-CM | POA: Diagnosis not present

## 2016-09-05 DIAGNOSIS — D490 Neoplasm of unspecified behavior of digestive system: Secondary | ICD-10-CM | POA: Diagnosis not present

## 2016-09-05 DIAGNOSIS — E785 Hyperlipidemia, unspecified: Secondary | ICD-10-CM | POA: Diagnosis not present

## 2016-09-05 DIAGNOSIS — K119 Disease of salivary gland, unspecified: Secondary | ICD-10-CM | POA: Diagnosis not present

## 2016-09-05 DIAGNOSIS — Z9889 Other specified postprocedural states: Secondary | ICD-10-CM | POA: Diagnosis not present

## 2016-09-05 DIAGNOSIS — R509 Fever, unspecified: Secondary | ICD-10-CM | POA: Diagnosis not present

## 2016-09-05 DIAGNOSIS — D119 Benign neoplasm of major salivary gland, unspecified: Secondary | ICD-10-CM | POA: Diagnosis not present

## 2016-09-05 DIAGNOSIS — I10 Essential (primary) hypertension: Secondary | ICD-10-CM | POA: Diagnosis not present

## 2016-09-05 DIAGNOSIS — N200 Calculus of kidney: Secondary | ICD-10-CM | POA: Diagnosis not present

## 2016-09-05 DIAGNOSIS — Z6836 Body mass index (BMI) 36.0-36.9, adult: Secondary | ICD-10-CM | POA: Diagnosis not present

## 2016-09-05 DIAGNOSIS — Z79899 Other long term (current) drug therapy: Secondary | ICD-10-CM | POA: Diagnosis not present

## 2016-09-05 DIAGNOSIS — Z794 Long term (current) use of insulin: Secondary | ICD-10-CM | POA: Diagnosis not present

## 2016-09-05 DIAGNOSIS — D110 Benign neoplasm of parotid gland: Secondary | ICD-10-CM | POA: Diagnosis not present

## 2016-09-05 DIAGNOSIS — M1990 Unspecified osteoarthritis, unspecified site: Secondary | ICD-10-CM | POA: Diagnosis not present

## 2016-09-05 DIAGNOSIS — N183 Chronic kidney disease, stage 3 (moderate): Secondary | ICD-10-CM | POA: Diagnosis not present

## 2016-09-05 DIAGNOSIS — I129 Hypertensive chronic kidney disease with stage 1 through stage 4 chronic kidney disease, or unspecified chronic kidney disease: Secondary | ICD-10-CM | POA: Diagnosis not present

## 2016-09-06 DIAGNOSIS — I129 Hypertensive chronic kidney disease with stage 1 through stage 4 chronic kidney disease, or unspecified chronic kidney disease: Secondary | ICD-10-CM | POA: Diagnosis not present

## 2016-09-06 DIAGNOSIS — C61 Malignant neoplasm of prostate: Secondary | ICD-10-CM | POA: Diagnosis not present

## 2016-09-06 DIAGNOSIS — D110 Benign neoplasm of parotid gland: Secondary | ICD-10-CM | POA: Diagnosis not present

## 2016-09-06 DIAGNOSIS — N183 Chronic kidney disease, stage 3 (moderate): Secondary | ICD-10-CM | POA: Diagnosis not present

## 2016-09-06 DIAGNOSIS — E1122 Type 2 diabetes mellitus with diabetic chronic kidney disease: Secondary | ICD-10-CM | POA: Diagnosis not present

## 2016-09-07 ENCOUNTER — Ambulatory Visit: Payer: Medicare Other | Admitting: Sports Medicine

## 2016-09-22 ENCOUNTER — Encounter: Payer: Self-pay | Admitting: Sports Medicine

## 2016-09-22 ENCOUNTER — Ambulatory Visit (INDEPENDENT_AMBULATORY_CARE_PROVIDER_SITE_OTHER): Payer: Medicare Other | Admitting: Sports Medicine

## 2016-09-22 DIAGNOSIS — M79676 Pain in unspecified toe(s): Secondary | ICD-10-CM

## 2016-09-22 DIAGNOSIS — B351 Tinea unguium: Secondary | ICD-10-CM | POA: Diagnosis not present

## 2016-09-22 DIAGNOSIS — E1140 Type 2 diabetes mellitus with diabetic neuropathy, unspecified: Secondary | ICD-10-CM | POA: Diagnosis not present

## 2016-09-22 NOTE — Progress Notes (Signed)
Subjective: [name] is a 82 y.o. male patient with history of diabetes who presents to office today complaining of long, painful nails  while ambulating in shoes; unable to trim. Patient states that the glucose reading this morning was not recorded. Patient denies any new changes in medication or new problems. Patient denies any new cramping, numbness, burning or tingling in the legs.  Patient Active Problem List   Diagnosis Date Noted  . S/P lumbar laminectomy 09/12/2014  . Prostate cancer ([place]) 03/10/2011  . Hypertension 03/10/2011  . High cholesterol 03/10/2011  . Kidney stones 03/10/2011  . Diabetic neuropathy ([place]) 03/10/2011  . Neuritis due to diabetes mellitus ([place]) 03/10/2011   Current Outpatient Prescriptions on File Prior to Visit  Medication Sig Dispense Refill  . acetaminophen (TYLENOL) 500 MG tablet Take 1,000 mg by mouth at bedtime as needed (pain).    Marland Kitchen acetaminophen (TYLENOL) 500 MG tablet Take 1,000 mg by mouth.    Marland Kitchen amLODipine (NORVASC) 5 MG tablet Take 5 mg by mouth at bedtime.     Marland Kitchen atorvastatin (LIPITOR) 10 MG tablet Take 10 mg by mouth at bedtime.     Marland Kitchen azithromycin (ZITHROMAX) 500 MG tablet     . cefUROXime (CEFTIN) 500 MG tablet     . citalopram (CELEXA) 40 MG tablet Take 40 mg by mouth daily.     Marland Kitchen gabapentin (NEURONTIN) 300 MG capsule Take 600 mg by mouth 2 (two) times daily.     . hydrALAZINE (APRESOLINE) 25 MG tablet Take 50 mg by mouth 2 (two) times daily.     Marland Kitchen HYDROcodone-acetaminophen (NORCO) 10-325 MG per tablet Take 1 tablet by mouth every 4 (four) hours as needed for moderate pain. 45 tablet 0  . HYDROcodone-acetaminophen (NORCO/VICODIN) 5-325 MG per tablet     . insulin glargine (LANTUS) 100 UNIT/ML injection Inject 15 Units into the skin.     Marland Kitchen insulin lispro (HUMALOG) 100 UNIT/ML injection Inject 5-20 Units into the skin 3 (three) times daily before meals. Based on sliding scale    . levofloxacin (LEVAQUIN) 750 MG tablet     . lisinopril  (PRINIVIL,ZESTRIL) 40 MG tablet Take 40 mg by mouth at bedtime.     Marland Kitchen MAGNESIUM PO Take 1 tablet by mouth 2 (two) times daily.    . Misc Natural Products (OSTEO BI-FLEX TRIPLE STRENGTH) TABS Take 1 tablet by mouth 2 (two) times daily.    . naproxen sodium (ANAPROX) 220 MG tablet Take 440 mg by mouth at bedtime as needed (pain).    . Omega-3 Fatty Acids (FISH OIL PO) Take 1 capsule by mouth 2 (two) times daily.    . predniSONE (DELTASONE) 10 MG tablet     . temazepam (RESTORIL) 30 MG capsule Take 30 mg by mouth at bedtime as needed for sleep.     . valsartan-hydrochlorothiazide (DIOVAN-HCT) 320-25 MG per tablet Take 1 tablet by mouth daily.     . valsartan-hydrochlorothiazide (DIOVAN-HCT) 320-25 MG per tablet     . VENTOLIN HFA 108 (90 BASE) MCG/ACT inhaler      No current facility-administered medications on file prior to visit.    Allergies  Allergen Reactions  . Oxycodone-Acetaminophen Anxiety and Other (See Comments)    NERVOUS AND SHAKY     No results found for this or any previous visit (from the past 2160 hour(s)).  Objective: General: Patient is awake, alert, and oriented x 3 and in no acute distress.  Integument: Skin is warm, dry and supple bilateral. Nails  are tender, long, thickened and dystrophic with subungual debris, consistent with onychomycosis, 1-5 bilateral. No signs of infection. No open lesions or preulcerative lesions present bilateral. Remaining integument unremarkable.  Vasculature:  Dorsalis Pedis pulse 1/4 bilateral. Posterior Tibial pulse  1/4 bilateral. Capillary fill time <5 sec 1-5 bilateral. No hair growth to the level of the digits.Temperature gradient within normal limits. Mild varicosities present bilateral. No edema present bilateral.   Neurology: The patient has intact sensation measured with a 5.07/10g Semmes Weinstein Monofilament at all pedal sites bilateral . Vibratory sensation diminished bilateral with tuning fork. No Babinski sign present  bilateral.   Musculoskeletal: Asymptomatic hammertoe pedal deformities noted bilateral. Muscular strength 5/5 in all lower extremity muscular groups bilateral without pain on range of motion . No tenderness with calf compression bilateral.  Assessment and Plan: Problem List Items Addressed This Visit    None    Visit Diagnoses    Pain due to onychomycosis of toenail    -  Primary   Type 2 diabetes, controlled, with neuropathy ([place])         -Examined patient. -Discussed and educated patient on diabetic foot care, especially with  regards to the vascular, neurological and musculoskeletal systems.  -Stressed the importance of good glycemic control and the detriment of not  controlling glucose levels in relation to the foot. -Mechanically debrided all nails 1-5 bilateral using sterile nail nipper and filed with dremel without incident  -Answered all patient questions -Patient to return  in 3 months for at risk foot care -Patient advised to call the office if any problems or questions arise in the meantime.  Landis Martins, DPM

## 2016-09-24 DIAGNOSIS — F5104 Psychophysiologic insomnia: Secondary | ICD-10-CM | POA: Diagnosis not present

## 2016-09-24 DIAGNOSIS — J019 Acute sinusitis, unspecified: Secondary | ICD-10-CM | POA: Diagnosis not present

## 2016-09-26 DIAGNOSIS — R0602 Shortness of breath: Secondary | ICD-10-CM | POA: Diagnosis not present

## 2016-09-26 DIAGNOSIS — Z794 Long term (current) use of insulin: Secondary | ICD-10-CM | POA: Diagnosis not present

## 2016-09-26 DIAGNOSIS — F17210 Nicotine dependence, cigarettes, uncomplicated: Secondary | ICD-10-CM | POA: Diagnosis not present

## 2016-09-26 DIAGNOSIS — I160 Hypertensive urgency: Secondary | ICD-10-CM | POA: Diagnosis present

## 2016-09-26 DIAGNOSIS — N183 Chronic kidney disease, stage 3 (moderate): Secondary | ICD-10-CM | POA: Diagnosis not present

## 2016-09-26 DIAGNOSIS — J9691 Respiratory failure, unspecified with hypoxia: Secondary | ICD-10-CM | POA: Diagnosis not present

## 2016-09-26 DIAGNOSIS — E119 Type 2 diabetes mellitus without complications: Secondary | ICD-10-CM | POA: Diagnosis not present

## 2016-09-26 DIAGNOSIS — R509 Fever, unspecified: Secondary | ICD-10-CM | POA: Diagnosis not present

## 2016-09-26 DIAGNOSIS — K219 Gastro-esophageal reflux disease without esophagitis: Secondary | ICD-10-CM | POA: Diagnosis present

## 2016-09-26 DIAGNOSIS — R0609 Other forms of dyspnea: Secondary | ICD-10-CM | POA: Diagnosis not present

## 2016-09-26 DIAGNOSIS — I130 Hypertensive heart and chronic kidney disease with heart failure and stage 1 through stage 4 chronic kidney disease, or unspecified chronic kidney disease: Secondary | ICD-10-CM | POA: Diagnosis not present

## 2016-09-26 DIAGNOSIS — J9601 Acute respiratory failure with hypoxia: Secondary | ICD-10-CM | POA: Diagnosis not present

## 2016-09-26 DIAGNOSIS — J9600 Acute respiratory failure, unspecified whether with hypoxia or hypercapnia: Secondary | ICD-10-CM | POA: Diagnosis not present

## 2016-09-26 DIAGNOSIS — R05 Cough: Secondary | ICD-10-CM | POA: Diagnosis not present

## 2016-09-26 DIAGNOSIS — R2689 Other abnormalities of gait and mobility: Secondary | ICD-10-CM | POA: Diagnosis not present

## 2016-09-26 DIAGNOSIS — E1122 Type 2 diabetes mellitus with diabetic chronic kidney disease: Secondary | ICD-10-CM | POA: Diagnosis present

## 2016-09-26 DIAGNOSIS — I5031 Acute diastolic (congestive) heart failure: Secondary | ICD-10-CM | POA: Diagnosis not present

## 2016-09-26 DIAGNOSIS — R911 Solitary pulmonary nodule: Secondary | ICD-10-CM | POA: Diagnosis not present

## 2016-09-26 DIAGNOSIS — G629 Polyneuropathy, unspecified: Secondary | ICD-10-CM | POA: Diagnosis not present

## 2016-09-26 DIAGNOSIS — D649 Anemia, unspecified: Secondary | ICD-10-CM | POA: Diagnosis not present

## 2016-09-26 DIAGNOSIS — J189 Pneumonia, unspecified organism: Secondary | ICD-10-CM | POA: Diagnosis not present

## 2016-09-26 DIAGNOSIS — I110 Hypertensive heart disease with heart failure: Secondary | ICD-10-CM | POA: Diagnosis not present

## 2016-09-26 DIAGNOSIS — J45909 Unspecified asthma, uncomplicated: Secondary | ICD-10-CM | POA: Diagnosis present

## 2016-09-26 DIAGNOSIS — I509 Heart failure, unspecified: Secondary | ICD-10-CM | POA: Diagnosis not present

## 2016-09-26 DIAGNOSIS — Z885 Allergy status to narcotic agent status: Secondary | ICD-10-CM | POA: Diagnosis not present

## 2016-09-26 DIAGNOSIS — R278 Other lack of coordination: Secondary | ICD-10-CM | POA: Diagnosis not present

## 2016-09-26 DIAGNOSIS — Z79899 Other long term (current) drug therapy: Secondary | ICD-10-CM | POA: Diagnosis not present

## 2016-09-26 DIAGNOSIS — N189 Chronic kidney disease, unspecified: Secondary | ICD-10-CM | POA: Diagnosis not present

## 2016-09-26 DIAGNOSIS — R69 Illness, unspecified: Secondary | ICD-10-CM | POA: Diagnosis not present

## 2016-09-26 DIAGNOSIS — F329 Major depressive disorder, single episode, unspecified: Secondary | ICD-10-CM | POA: Diagnosis present

## 2016-09-26 DIAGNOSIS — E8779 Other fluid overload: Secondary | ICD-10-CM | POA: Diagnosis not present

## 2016-09-26 DIAGNOSIS — Z8546 Personal history of malignant neoplasm of prostate: Secondary | ICD-10-CM | POA: Diagnosis not present

## 2016-09-26 DIAGNOSIS — R600 Localized edema: Secondary | ICD-10-CM | POA: Diagnosis not present

## 2016-09-26 DIAGNOSIS — R5383 Other fatigue: Secondary | ICD-10-CM | POA: Diagnosis not present

## 2016-09-26 DIAGNOSIS — I10 Essential (primary) hypertension: Secondary | ICD-10-CM | POA: Diagnosis not present

## 2016-09-26 DIAGNOSIS — M6281 Muscle weakness (generalized): Secondary | ICD-10-CM | POA: Diagnosis not present

## 2016-09-27 DIAGNOSIS — N189 Chronic kidney disease, unspecified: Secondary | ICD-10-CM

## 2016-09-27 DIAGNOSIS — J9601 Acute respiratory failure with hypoxia: Secondary | ICD-10-CM

## 2016-09-27 DIAGNOSIS — I110 Hypertensive heart disease with heart failure: Secondary | ICD-10-CM

## 2016-09-27 DIAGNOSIS — J189 Pneumonia, unspecified organism: Secondary | ICD-10-CM

## 2016-09-27 DIAGNOSIS — D649 Anemia, unspecified: Secondary | ICD-10-CM

## 2016-09-27 DIAGNOSIS — I10 Essential (primary) hypertension: Secondary | ICD-10-CM

## 2016-09-27 DIAGNOSIS — I509 Heart failure, unspecified: Secondary | ICD-10-CM

## 2016-10-03 DIAGNOSIS — J9600 Acute respiratory failure, unspecified whether with hypoxia or hypercapnia: Secondary | ICD-10-CM | POA: Diagnosis not present

## 2016-10-03 DIAGNOSIS — J9691 Respiratory failure, unspecified with hypoxia: Secondary | ICD-10-CM | POA: Diagnosis not present

## 2016-10-03 DIAGNOSIS — I110 Hypertensive heart disease with heart failure: Secondary | ICD-10-CM | POA: Diagnosis not present

## 2016-10-03 DIAGNOSIS — G629 Polyneuropathy, unspecified: Secondary | ICD-10-CM | POA: Diagnosis not present

## 2016-10-03 DIAGNOSIS — D649 Anemia, unspecified: Secondary | ICD-10-CM | POA: Diagnosis not present

## 2016-10-03 DIAGNOSIS — E119 Type 2 diabetes mellitus without complications: Secondary | ICD-10-CM | POA: Diagnosis not present

## 2016-10-03 DIAGNOSIS — N189 Chronic kidney disease, unspecified: Secondary | ICD-10-CM | POA: Diagnosis not present

## 2016-10-03 DIAGNOSIS — I10 Essential (primary) hypertension: Secondary | ICD-10-CM | POA: Diagnosis not present

## 2016-10-03 DIAGNOSIS — R5383 Other fatigue: Secondary | ICD-10-CM | POA: Diagnosis not present

## 2016-10-03 DIAGNOSIS — I509 Heart failure, unspecified: Secondary | ICD-10-CM | POA: Diagnosis not present

## 2016-10-03 DIAGNOSIS — J9601 Acute respiratory failure with hypoxia: Secondary | ICD-10-CM | POA: Diagnosis not present

## 2016-10-03 DIAGNOSIS — J189 Pneumonia, unspecified organism: Secondary | ICD-10-CM | POA: Diagnosis not present

## 2016-10-03 DIAGNOSIS — R2689 Other abnormalities of gait and mobility: Secondary | ICD-10-CM | POA: Diagnosis not present

## 2016-10-03 DIAGNOSIS — R278 Other lack of coordination: Secondary | ICD-10-CM | POA: Diagnosis not present

## 2016-10-03 DIAGNOSIS — M6281 Muscle weakness (generalized): Secondary | ICD-10-CM | POA: Diagnosis not present

## 2016-10-13 DIAGNOSIS — F329 Major depressive disorder, single episode, unspecified: Secondary | ICD-10-CM | POA: Diagnosis not present

## 2016-10-13 DIAGNOSIS — Z8701 Personal history of pneumonia (recurrent): Secondary | ICD-10-CM | POA: Diagnosis not present

## 2016-10-13 DIAGNOSIS — I872 Venous insufficiency (chronic) (peripheral): Secondary | ICD-10-CM | POA: Diagnosis not present

## 2016-10-13 DIAGNOSIS — F17210 Nicotine dependence, cigarettes, uncomplicated: Secondary | ICD-10-CM | POA: Diagnosis not present

## 2016-10-13 DIAGNOSIS — I130 Hypertensive heart and chronic kidney disease with heart failure and stage 1 through stage 4 chronic kidney disease, or unspecified chronic kidney disease: Secondary | ICD-10-CM | POA: Diagnosis not present

## 2016-10-13 DIAGNOSIS — E1122 Type 2 diabetes mellitus with diabetic chronic kidney disease: Secondary | ICD-10-CM | POA: Diagnosis not present

## 2016-10-13 DIAGNOSIS — Z8546 Personal history of malignant neoplasm of prostate: Secondary | ICD-10-CM | POA: Diagnosis not present

## 2016-10-13 DIAGNOSIS — Z794 Long term (current) use of insulin: Secondary | ICD-10-CM | POA: Diagnosis not present

## 2016-10-13 DIAGNOSIS — N183 Chronic kidney disease, stage 3 (moderate): Secondary | ICD-10-CM | POA: Diagnosis not present

## 2016-10-13 DIAGNOSIS — I509 Heart failure, unspecified: Secondary | ICD-10-CM | POA: Diagnosis not present

## 2016-10-15 DIAGNOSIS — E785 Hyperlipidemia, unspecified: Secondary | ICD-10-CM | POA: Diagnosis not present

## 2016-10-15 DIAGNOSIS — D638 Anemia in other chronic diseases classified elsewhere: Secondary | ICD-10-CM | POA: Diagnosis not present

## 2016-10-15 DIAGNOSIS — Z6834 Body mass index (BMI) 34.0-34.9, adult: Secondary | ICD-10-CM | POA: Diagnosis not present

## 2016-10-15 DIAGNOSIS — I5031 Acute diastolic (congestive) heart failure: Secondary | ICD-10-CM | POA: Diagnosis not present

## 2016-10-15 DIAGNOSIS — N183 Chronic kidney disease, stage 3 (moderate): Secondary | ICD-10-CM | POA: Diagnosis not present

## 2016-10-15 DIAGNOSIS — J189 Pneumonia, unspecified organism: Secondary | ICD-10-CM | POA: Diagnosis not present

## 2016-10-15 DIAGNOSIS — E1142 Type 2 diabetes mellitus with diabetic polyneuropathy: Secondary | ICD-10-CM | POA: Diagnosis not present

## 2016-10-15 DIAGNOSIS — J9601 Acute respiratory failure with hypoxia: Secondary | ICD-10-CM | POA: Diagnosis not present

## 2016-10-15 DIAGNOSIS — I10 Essential (primary) hypertension: Secondary | ICD-10-CM | POA: Diagnosis not present

## 2016-10-17 DIAGNOSIS — I509 Heart failure, unspecified: Secondary | ICD-10-CM | POA: Diagnosis not present

## 2016-10-17 DIAGNOSIS — I872 Venous insufficiency (chronic) (peripheral): Secondary | ICD-10-CM | POA: Diagnosis not present

## 2016-10-17 DIAGNOSIS — F17210 Nicotine dependence, cigarettes, uncomplicated: Secondary | ICD-10-CM | POA: Diagnosis not present

## 2016-10-17 DIAGNOSIS — I130 Hypertensive heart and chronic kidney disease with heart failure and stage 1 through stage 4 chronic kidney disease, or unspecified chronic kidney disease: Secondary | ICD-10-CM | POA: Diagnosis not present

## 2016-10-17 DIAGNOSIS — N183 Chronic kidney disease, stage 3 (moderate): Secondary | ICD-10-CM | POA: Diagnosis not present

## 2016-10-17 DIAGNOSIS — E1122 Type 2 diabetes mellitus with diabetic chronic kidney disease: Secondary | ICD-10-CM | POA: Diagnosis not present

## 2016-10-18 DIAGNOSIS — E1122 Type 2 diabetes mellitus with diabetic chronic kidney disease: Secondary | ICD-10-CM | POA: Diagnosis not present

## 2016-10-18 DIAGNOSIS — R0602 Shortness of breath: Secondary | ICD-10-CM | POA: Diagnosis not present

## 2016-10-18 DIAGNOSIS — I130 Hypertensive heart and chronic kidney disease with heart failure and stage 1 through stage 4 chronic kidney disease, or unspecified chronic kidney disease: Secondary | ICD-10-CM | POA: Diagnosis not present

## 2016-10-18 DIAGNOSIS — N183 Chronic kidney disease, stage 3 (moderate): Secondary | ICD-10-CM | POA: Diagnosis not present

## 2016-10-18 DIAGNOSIS — I509 Heart failure, unspecified: Secondary | ICD-10-CM | POA: Diagnosis not present

## 2016-10-18 DIAGNOSIS — I872 Venous insufficiency (chronic) (peripheral): Secondary | ICD-10-CM | POA: Diagnosis not present

## 2016-10-18 DIAGNOSIS — R05 Cough: Secondary | ICD-10-CM | POA: Diagnosis not present

## 2016-10-18 DIAGNOSIS — F17210 Nicotine dependence, cigarettes, uncomplicated: Secondary | ICD-10-CM | POA: Diagnosis not present

## 2016-10-18 DIAGNOSIS — J189 Pneumonia, unspecified organism: Secondary | ICD-10-CM | POA: Diagnosis not present

## 2016-10-20 DIAGNOSIS — I872 Venous insufficiency (chronic) (peripheral): Secondary | ICD-10-CM | POA: Diagnosis not present

## 2016-10-20 DIAGNOSIS — F17210 Nicotine dependence, cigarettes, uncomplicated: Secondary | ICD-10-CM | POA: Diagnosis not present

## 2016-10-20 DIAGNOSIS — E1122 Type 2 diabetes mellitus with diabetic chronic kidney disease: Secondary | ICD-10-CM | POA: Diagnosis not present

## 2016-10-20 DIAGNOSIS — I130 Hypertensive heart and chronic kidney disease with heart failure and stage 1 through stage 4 chronic kidney disease, or unspecified chronic kidney disease: Secondary | ICD-10-CM | POA: Diagnosis not present

## 2016-10-20 DIAGNOSIS — I509 Heart failure, unspecified: Secondary | ICD-10-CM | POA: Diagnosis not present

## 2016-10-20 DIAGNOSIS — N183 Chronic kidney disease, stage 3 (moderate): Secondary | ICD-10-CM | POA: Diagnosis not present

## 2016-10-26 DIAGNOSIS — F17210 Nicotine dependence, cigarettes, uncomplicated: Secondary | ICD-10-CM | POA: Diagnosis not present

## 2016-10-26 DIAGNOSIS — E1122 Type 2 diabetes mellitus with diabetic chronic kidney disease: Secondary | ICD-10-CM | POA: Diagnosis not present

## 2016-10-26 DIAGNOSIS — I872 Venous insufficiency (chronic) (peripheral): Secondary | ICD-10-CM | POA: Diagnosis not present

## 2016-10-26 DIAGNOSIS — I130 Hypertensive heart and chronic kidney disease with heart failure and stage 1 through stage 4 chronic kidney disease, or unspecified chronic kidney disease: Secondary | ICD-10-CM | POA: Diagnosis not present

## 2016-10-26 DIAGNOSIS — N183 Chronic kidney disease, stage 3 (moderate): Secondary | ICD-10-CM | POA: Diagnosis not present

## 2016-10-26 DIAGNOSIS — I509 Heart failure, unspecified: Secondary | ICD-10-CM | POA: Diagnosis not present

## 2016-10-27 DIAGNOSIS — I130 Hypertensive heart and chronic kidney disease with heart failure and stage 1 through stage 4 chronic kidney disease, or unspecified chronic kidney disease: Secondary | ICD-10-CM | POA: Diagnosis not present

## 2016-10-27 DIAGNOSIS — F17210 Nicotine dependence, cigarettes, uncomplicated: Secondary | ICD-10-CM | POA: Diagnosis not present

## 2016-10-27 DIAGNOSIS — I872 Venous insufficiency (chronic) (peripheral): Secondary | ICD-10-CM | POA: Diagnosis not present

## 2016-10-27 DIAGNOSIS — E1122 Type 2 diabetes mellitus with diabetic chronic kidney disease: Secondary | ICD-10-CM | POA: Diagnosis not present

## 2016-10-27 DIAGNOSIS — N183 Chronic kidney disease, stage 3 (moderate): Secondary | ICD-10-CM | POA: Diagnosis not present

## 2016-10-27 DIAGNOSIS — I509 Heart failure, unspecified: Secondary | ICD-10-CM | POA: Diagnosis not present

## 2016-10-31 DIAGNOSIS — I10 Essential (primary) hypertension: Secondary | ICD-10-CM | POA: Diagnosis not present

## 2016-10-31 DIAGNOSIS — Z6834 Body mass index (BMI) 34.0-34.9, adult: Secondary | ICD-10-CM | POA: Diagnosis not present

## 2016-11-02 DIAGNOSIS — I509 Heart failure, unspecified: Secondary | ICD-10-CM | POA: Diagnosis not present

## 2016-11-02 DIAGNOSIS — N183 Chronic kidney disease, stage 3 (moderate): Secondary | ICD-10-CM | POA: Diagnosis not present

## 2016-11-02 DIAGNOSIS — E1122 Type 2 diabetes mellitus with diabetic chronic kidney disease: Secondary | ICD-10-CM | POA: Diagnosis not present

## 2016-11-02 DIAGNOSIS — I872 Venous insufficiency (chronic) (peripheral): Secondary | ICD-10-CM | POA: Diagnosis not present

## 2016-11-02 DIAGNOSIS — I130 Hypertensive heart and chronic kidney disease with heart failure and stage 1 through stage 4 chronic kidney disease, or unspecified chronic kidney disease: Secondary | ICD-10-CM | POA: Diagnosis not present

## 2016-11-02 DIAGNOSIS — F17210 Nicotine dependence, cigarettes, uncomplicated: Secondary | ICD-10-CM | POA: Diagnosis not present

## 2016-11-03 DIAGNOSIS — I872 Venous insufficiency (chronic) (peripheral): Secondary | ICD-10-CM | POA: Diagnosis not present

## 2016-11-03 DIAGNOSIS — I509 Heart failure, unspecified: Secondary | ICD-10-CM | POA: Diagnosis not present

## 2016-11-03 DIAGNOSIS — I130 Hypertensive heart and chronic kidney disease with heart failure and stage 1 through stage 4 chronic kidney disease, or unspecified chronic kidney disease: Secondary | ICD-10-CM | POA: Diagnosis not present

## 2016-11-03 DIAGNOSIS — F17210 Nicotine dependence, cigarettes, uncomplicated: Secondary | ICD-10-CM | POA: Diagnosis not present

## 2016-11-03 DIAGNOSIS — N183 Chronic kidney disease, stage 3 (moderate): Secondary | ICD-10-CM | POA: Diagnosis not present

## 2016-11-03 DIAGNOSIS — E1122 Type 2 diabetes mellitus with diabetic chronic kidney disease: Secondary | ICD-10-CM | POA: Diagnosis not present

## 2016-11-08 DIAGNOSIS — I130 Hypertensive heart and chronic kidney disease with heart failure and stage 1 through stage 4 chronic kidney disease, or unspecified chronic kidney disease: Secondary | ICD-10-CM | POA: Diagnosis not present

## 2016-11-08 DIAGNOSIS — I872 Venous insufficiency (chronic) (peripheral): Secondary | ICD-10-CM | POA: Diagnosis not present

## 2016-11-08 DIAGNOSIS — F17210 Nicotine dependence, cigarettes, uncomplicated: Secondary | ICD-10-CM | POA: Diagnosis not present

## 2016-11-08 DIAGNOSIS — E1122 Type 2 diabetes mellitus with diabetic chronic kidney disease: Secondary | ICD-10-CM | POA: Diagnosis not present

## 2016-11-08 DIAGNOSIS — I509 Heart failure, unspecified: Secondary | ICD-10-CM | POA: Diagnosis not present

## 2016-11-08 DIAGNOSIS — N183 Chronic kidney disease, stage 3 (moderate): Secondary | ICD-10-CM | POA: Diagnosis not present

## 2016-11-10 DIAGNOSIS — I130 Hypertensive heart and chronic kidney disease with heart failure and stage 1 through stage 4 chronic kidney disease, or unspecified chronic kidney disease: Secondary | ICD-10-CM | POA: Diagnosis not present

## 2016-11-10 DIAGNOSIS — N183 Chronic kidney disease, stage 3 (moderate): Secondary | ICD-10-CM | POA: Diagnosis not present

## 2016-11-10 DIAGNOSIS — F17210 Nicotine dependence, cigarettes, uncomplicated: Secondary | ICD-10-CM | POA: Diagnosis not present

## 2016-11-10 DIAGNOSIS — I509 Heart failure, unspecified: Secondary | ICD-10-CM | POA: Diagnosis not present

## 2016-11-10 DIAGNOSIS — I872 Venous insufficiency (chronic) (peripheral): Secondary | ICD-10-CM | POA: Diagnosis not present

## 2016-11-10 DIAGNOSIS — E1122 Type 2 diabetes mellitus with diabetic chronic kidney disease: Secondary | ICD-10-CM | POA: Diagnosis not present

## 2016-11-15 DIAGNOSIS — I872 Venous insufficiency (chronic) (peripheral): Secondary | ICD-10-CM | POA: Diagnosis not present

## 2016-11-15 DIAGNOSIS — I130 Hypertensive heart and chronic kidney disease with heart failure and stage 1 through stage 4 chronic kidney disease, or unspecified chronic kidney disease: Secondary | ICD-10-CM | POA: Diagnosis not present

## 2016-11-15 DIAGNOSIS — I509 Heart failure, unspecified: Secondary | ICD-10-CM | POA: Diagnosis not present

## 2016-11-15 DIAGNOSIS — F17210 Nicotine dependence, cigarettes, uncomplicated: Secondary | ICD-10-CM | POA: Diagnosis not present

## 2016-11-15 DIAGNOSIS — N183 Chronic kidney disease, stage 3 (moderate): Secondary | ICD-10-CM | POA: Diagnosis not present

## 2016-11-15 DIAGNOSIS — E1122 Type 2 diabetes mellitus with diabetic chronic kidney disease: Secondary | ICD-10-CM | POA: Diagnosis not present

## 2016-11-22 DIAGNOSIS — I130 Hypertensive heart and chronic kidney disease with heart failure and stage 1 through stage 4 chronic kidney disease, or unspecified chronic kidney disease: Secondary | ICD-10-CM | POA: Diagnosis not present

## 2016-11-22 DIAGNOSIS — E1122 Type 2 diabetes mellitus with diabetic chronic kidney disease: Secondary | ICD-10-CM | POA: Diagnosis not present

## 2016-11-22 DIAGNOSIS — N183 Chronic kidney disease, stage 3 (moderate): Secondary | ICD-10-CM | POA: Diagnosis not present

## 2016-11-22 DIAGNOSIS — I872 Venous insufficiency (chronic) (peripheral): Secondary | ICD-10-CM | POA: Diagnosis not present

## 2016-11-22 DIAGNOSIS — F17210 Nicotine dependence, cigarettes, uncomplicated: Secondary | ICD-10-CM | POA: Diagnosis not present

## 2016-11-22 DIAGNOSIS — I509 Heart failure, unspecified: Secondary | ICD-10-CM | POA: Diagnosis not present

## 2016-12-02 DIAGNOSIS — I509 Heart failure, unspecified: Secondary | ICD-10-CM | POA: Diagnosis not present

## 2016-12-02 DIAGNOSIS — F17210 Nicotine dependence, cigarettes, uncomplicated: Secondary | ICD-10-CM | POA: Diagnosis not present

## 2016-12-02 DIAGNOSIS — E1122 Type 2 diabetes mellitus with diabetic chronic kidney disease: Secondary | ICD-10-CM | POA: Diagnosis not present

## 2016-12-02 DIAGNOSIS — I872 Venous insufficiency (chronic) (peripheral): Secondary | ICD-10-CM | POA: Diagnosis not present

## 2016-12-02 DIAGNOSIS — N183 Chronic kidney disease, stage 3 (moderate): Secondary | ICD-10-CM | POA: Diagnosis not present

## 2016-12-02 DIAGNOSIS — I130 Hypertensive heart and chronic kidney disease with heart failure and stage 1 through stage 4 chronic kidney disease, or unspecified chronic kidney disease: Secondary | ICD-10-CM | POA: Diagnosis not present

## 2016-12-05 DIAGNOSIS — E1122 Type 2 diabetes mellitus with diabetic chronic kidney disease: Secondary | ICD-10-CM | POA: Diagnosis not present

## 2016-12-05 DIAGNOSIS — I130 Hypertensive heart and chronic kidney disease with heart failure and stage 1 through stage 4 chronic kidney disease, or unspecified chronic kidney disease: Secondary | ICD-10-CM | POA: Diagnosis not present

## 2016-12-05 DIAGNOSIS — I509 Heart failure, unspecified: Secondary | ICD-10-CM | POA: Diagnosis not present

## 2016-12-05 DIAGNOSIS — I872 Venous insufficiency (chronic) (peripheral): Secondary | ICD-10-CM | POA: Diagnosis not present

## 2016-12-05 DIAGNOSIS — N183 Chronic kidney disease, stage 3 (moderate): Secondary | ICD-10-CM | POA: Diagnosis not present

## 2016-12-05 DIAGNOSIS — F17210 Nicotine dependence, cigarettes, uncomplicated: Secondary | ICD-10-CM | POA: Diagnosis not present

## 2016-12-07 DIAGNOSIS — M47817 Spondylosis without myelopathy or radiculopathy, lumbosacral region: Secondary | ICD-10-CM | POA: Diagnosis not present

## 2016-12-07 DIAGNOSIS — M47816 Spondylosis without myelopathy or radiculopathy, lumbar region: Secondary | ICD-10-CM | POA: Diagnosis not present

## 2016-12-22 ENCOUNTER — Ambulatory Visit: Payer: Medicare Other | Admitting: Sports Medicine

## 2016-12-26 DIAGNOSIS — M170 Bilateral primary osteoarthritis of knee: Secondary | ICD-10-CM | POA: Diagnosis not present

## 2016-12-26 DIAGNOSIS — M25561 Pain in right knee: Secondary | ICD-10-CM | POA: Diagnosis not present

## 2016-12-26 DIAGNOSIS — M25562 Pain in left knee: Secondary | ICD-10-CM | POA: Diagnosis not present

## 2017-01-03 DIAGNOSIS — M25562 Pain in left knee: Secondary | ICD-10-CM | POA: Diagnosis not present

## 2017-01-03 DIAGNOSIS — M25561 Pain in right knee: Secondary | ICD-10-CM | POA: Diagnosis not present

## 2017-01-03 DIAGNOSIS — M170 Bilateral primary osteoarthritis of knee: Secondary | ICD-10-CM | POA: Diagnosis not present

## 2017-01-05 DIAGNOSIS — M419 Scoliosis, unspecified: Secondary | ICD-10-CM | POA: Diagnosis not present

## 2017-01-05 DIAGNOSIS — M47816 Spondylosis without myelopathy or radiculopathy, lumbar region: Secondary | ICD-10-CM | POA: Diagnosis not present

## 2017-01-05 DIAGNOSIS — M4316 Spondylolisthesis, lumbar region: Secondary | ICD-10-CM | POA: Diagnosis not present

## 2017-01-05 DIAGNOSIS — M5136 Other intervertebral disc degeneration, lumbar region: Secondary | ICD-10-CM | POA: Diagnosis not present

## 2017-01-06 ENCOUNTER — Ambulatory Visit (INDEPENDENT_AMBULATORY_CARE_PROVIDER_SITE_OTHER): Payer: Medicare Other | Admitting: Sports Medicine

## 2017-01-06 DIAGNOSIS — M79676 Pain in unspecified toe(s): Secondary | ICD-10-CM

## 2017-01-06 DIAGNOSIS — B351 Tinea unguium: Secondary | ICD-10-CM | POA: Diagnosis not present

## 2017-01-06 DIAGNOSIS — E1140 Type 2 diabetes mellitus with diabetic neuropathy, unspecified: Secondary | ICD-10-CM

## 2017-01-06 NOTE — Progress Notes (Signed)
Subjective: [name] is a 82 y.o. male patient with history of diabetes who presents to office today complaining of long, painful nails  while ambulating in shoes; unable to trim. Patient states that the glucose reading this morning was not recorded but last night had a low of 33. Was in hospital after last visit for PNA. Patient denies any other new changes in medication or new problems.  Patient Active Problem List   Diagnosis Date Noted  . S/P lumbar laminectomy 09/12/2014  . Prostate cancer ([place]) 03/10/2011  . Hypertension 03/10/2011  . High cholesterol 03/10/2011  . Kidney stones 03/10/2011  . Diabetic neuropathy ([place]) 03/10/2011  . Neuritis due to diabetes mellitus ([place]) 03/10/2011   Current Outpatient Prescriptions on File Prior to Visit  Medication Sig Dispense Refill  . acetaminophen (TYLENOL) 500 MG tablet Take 1,000 mg by mouth at bedtime as needed (pain).    Marland Kitchen acetaminophen (TYLENOL) 500 MG tablet Take 1,000 mg by mouth.    Marland Kitchen amLODipine (NORVASC) 5 MG tablet Take 5 mg by mouth at bedtime.     Marland Kitchen atorvastatin (LIPITOR) 10 MG tablet Take 10 mg by mouth at bedtime.     Marland Kitchen azithromycin (ZITHROMAX) 500 MG tablet     . cefUROXime (CEFTIN) 500 MG tablet     . citalopram (CELEXA) 40 MG tablet Take 40 mg by mouth daily.     Marland Kitchen gabapentin (NEURONTIN) 300 MG capsule Take 600 mg by mouth 2 (two) times daily.     . hydrALAZINE (APRESOLINE) 25 MG tablet Take 50 mg by mouth 2 (two) times daily.     Marland Kitchen HYDROcodone-acetaminophen (NORCO) 10-325 MG per tablet Take 1 tablet by mouth every 4 (four) hours as needed for moderate pain. 45 tablet 0  . HYDROcodone-acetaminophen (NORCO/VICODIN) 5-325 MG per tablet     . insulin glargine (LANTUS) 100 UNIT/ML injection Inject 15 Units into the skin.     Marland Kitchen insulin lispro (HUMALOG) 100 UNIT/ML injection Inject 5-20 Units into the skin 3 (three) times daily before meals. Based on sliding scale    . levofloxacin (LEVAQUIN) 750 MG tablet     . lisinopril  (PRINIVIL,ZESTRIL) 40 MG tablet Take 40 mg by mouth at bedtime.     Marland Kitchen MAGNESIUM PO Take 1 tablet by mouth 2 (two) times daily.    . Misc Natural Products (OSTEO BI-FLEX TRIPLE STRENGTH) TABS Take 1 tablet by mouth 2 (two) times daily.    . naproxen sodium (ANAPROX) 220 MG tablet Take 440 mg by mouth at bedtime as needed (pain).    . Omega-3 Fatty Acids (FISH OIL PO) Take 1 capsule by mouth 2 (two) times daily.    . predniSONE (DELTASONE) 10 MG tablet     . temazepam (RESTORIL) 30 MG capsule Take 30 mg by mouth at bedtime as needed for sleep.     . valsartan-hydrochlorothiazide (DIOVAN-HCT) 320-25 MG per tablet Take 1 tablet by mouth daily.     . valsartan-hydrochlorothiazide (DIOVAN-HCT) 320-25 MG per tablet     . VENTOLIN HFA 108 (90 BASE) MCG/ACT inhaler      No current facility-administered medications on file prior to visit.    Allergies  Allergen Reactions  . Oxycodone-Acetaminophen Anxiety and Other (See Comments)    NERVOUS AND SHAKY     No results found for this or any previous visit (from the past 2160 hour(s)).  Objective: General: Patient is awake, alert, and oriented x 3 and in no acute distress.  Integument: Skin is warm,  dry and supple bilateral. Nails are tender, long, thickened and dystrophic with subungual debris, consistent with onychomycosis, 1-5 bilateral. No signs of infection. No open lesions or preulcerative lesions present bilateral. Remaining integument unremarkable.  Vasculature:  Dorsalis Pedis pulse 1/4 bilateral. Posterior Tibial pulse  1/4 bilateral. Capillary fill time <5 sec 1-5 bilateral. No hair growth to the level of the digits.Temperature gradient within normal limits. Mild varicosities present bilateral. No edema present bilateral.   Neurology: The patient has intact sensation measured with a 5.07/10g Semmes Weinstein Monofilament at all pedal sites bilateral . Vibratory sensation diminished bilateral with tuning fork. No Babinski sign present  bilateral.   Musculoskeletal: Asymptomatic hammertoe pedal deformities noted bilateral. Muscular strength 5/5 in all lower extremity muscular groups bilateral without pain on range of motion . No tenderness with calf compression bilateral.  Assessment and Plan: Problem List Items Addressed This Visit    None    Visit Diagnoses    Pain due to onychomycosis of toenail    -  Primary   Type 2 diabetes, controlled, with neuropathy ([name])         -Examined patient. -Discussed and educated patient on diabetic foot care, especially with  regards to the vascular, neurological and musculoskeletal systems.  -Stressed the importance of good glycemic control and the detriment of not  controlling glucose levels in relation to the foot. -Mechanically debrided all nails 1-5 bilateral using sterile nail nipper and filed with dremel without incident  -Answered all patient questions -Patient to return  in 3 months for at risk foot care -Patient advised to call the office if any problems or questions arise in the meantime.  Landis Martins, DPM

## 2017-01-11 DIAGNOSIS — M25561 Pain in right knee: Secondary | ICD-10-CM | POA: Diagnosis not present

## 2017-01-11 DIAGNOSIS — M25562 Pain in left knee: Secondary | ICD-10-CM | POA: Diagnosis not present

## 2017-01-11 DIAGNOSIS — M170 Bilateral primary osteoarthritis of knee: Secondary | ICD-10-CM | POA: Diagnosis not present

## 2017-01-16 DIAGNOSIS — N183 Chronic kidney disease, stage 3 (moderate): Secondary | ICD-10-CM | POA: Diagnosis not present

## 2017-01-16 DIAGNOSIS — E1142 Type 2 diabetes mellitus with diabetic polyneuropathy: Secondary | ICD-10-CM | POA: Diagnosis not present

## 2017-01-16 DIAGNOSIS — D649 Anemia, unspecified: Secondary | ICD-10-CM | POA: Diagnosis not present

## 2017-01-16 DIAGNOSIS — D638 Anemia in other chronic diseases classified elsewhere: Secondary | ICD-10-CM | POA: Diagnosis not present

## 2017-01-16 DIAGNOSIS — E785 Hyperlipidemia, unspecified: Secondary | ICD-10-CM | POA: Diagnosis not present

## 2017-01-16 DIAGNOSIS — D510 Vitamin B12 deficiency anemia due to intrinsic factor deficiency: Secondary | ICD-10-CM | POA: Diagnosis not present

## 2017-01-17 DIAGNOSIS — M5136 Other intervertebral disc degeneration, lumbar region: Secondary | ICD-10-CM | POA: Diagnosis not present

## 2017-01-18 DIAGNOSIS — D638 Anemia in other chronic diseases classified elsewhere: Secondary | ICD-10-CM | POA: Diagnosis not present

## 2017-01-18 DIAGNOSIS — Z6836 Body mass index (BMI) 36.0-36.9, adult: Secondary | ICD-10-CM | POA: Diagnosis not present

## 2017-01-18 DIAGNOSIS — E1142 Type 2 diabetes mellitus with diabetic polyneuropathy: Secondary | ICD-10-CM | POA: Diagnosis not present

## 2017-01-18 DIAGNOSIS — I10 Essential (primary) hypertension: Secondary | ICD-10-CM | POA: Diagnosis not present

## 2017-01-18 DIAGNOSIS — N183 Chronic kidney disease, stage 3 (moderate): Secondary | ICD-10-CM | POA: Diagnosis not present

## 2017-01-18 DIAGNOSIS — E785 Hyperlipidemia, unspecified: Secondary | ICD-10-CM | POA: Diagnosis not present

## 2017-01-18 DIAGNOSIS — Z9181 History of falling: Secondary | ICD-10-CM | POA: Diagnosis not present

## 2017-02-07 DIAGNOSIS — M5136 Other intervertebral disc degeneration, lumbar region: Secondary | ICD-10-CM | POA: Diagnosis not present

## 2017-02-07 DIAGNOSIS — M47816 Spondylosis without myelopathy or radiculopathy, lumbar region: Secondary | ICD-10-CM | POA: Diagnosis not present

## 2017-02-07 DIAGNOSIS — M4316 Spondylolisthesis, lumbar region: Secondary | ICD-10-CM | POA: Diagnosis not present

## 2017-02-07 DIAGNOSIS — M419 Scoliosis, unspecified: Secondary | ICD-10-CM | POA: Diagnosis not present

## 2017-02-09 DIAGNOSIS — D649 Anemia, unspecified: Secondary | ICD-10-CM | POA: Diagnosis not present

## 2017-02-17 DIAGNOSIS — D509 Iron deficiency anemia, unspecified: Secondary | ICD-10-CM

## 2017-02-17 DIAGNOSIS — N189 Chronic kidney disease, unspecified: Secondary | ICD-10-CM

## 2017-02-17 DIAGNOSIS — C61 Malignant neoplasm of prostate: Secondary | ICD-10-CM | POA: Diagnosis not present

## 2017-02-17 DIAGNOSIS — M4316 Spondylolisthesis, lumbar region: Secondary | ICD-10-CM | POA: Diagnosis not present

## 2017-02-17 DIAGNOSIS — M5126 Other intervertebral disc displacement, lumbar region: Secondary | ICD-10-CM | POA: Diagnosis not present

## 2017-02-17 DIAGNOSIS — D631 Anemia in chronic kidney disease: Secondary | ICD-10-CM | POA: Diagnosis not present

## 2017-02-17 DIAGNOSIS — Z801 Family history of malignant neoplasm of trachea, bronchus and lung: Secondary | ICD-10-CM | POA: Diagnosis not present

## 2017-02-20 DIAGNOSIS — D509 Iron deficiency anemia, unspecified: Secondary | ICD-10-CM | POA: Diagnosis not present

## 2017-02-23 DIAGNOSIS — I10 Essential (primary) hypertension: Secondary | ICD-10-CM | POA: Diagnosis not present

## 2017-02-23 DIAGNOSIS — C61 Malignant neoplasm of prostate: Secondary | ICD-10-CM | POA: Diagnosis not present

## 2017-02-23 DIAGNOSIS — F5104 Psychophysiologic insomnia: Secondary | ICD-10-CM | POA: Diagnosis not present

## 2017-02-24 DIAGNOSIS — D509 Iron deficiency anemia, unspecified: Secondary | ICD-10-CM | POA: Diagnosis not present

## 2017-03-02 DIAGNOSIS — R351 Nocturia: Secondary | ICD-10-CM | POA: Diagnosis not present

## 2017-03-02 DIAGNOSIS — C61 Malignant neoplasm of prostate: Secondary | ICD-10-CM | POA: Diagnosis not present

## 2017-03-09 DIAGNOSIS — M5136 Other intervertebral disc degeneration, lumbar region: Secondary | ICD-10-CM | POA: Diagnosis not present

## 2017-03-09 DIAGNOSIS — M5416 Radiculopathy, lumbar region: Secondary | ICD-10-CM | POA: Diagnosis not present

## 2017-03-28 DIAGNOSIS — M5416 Radiculopathy, lumbar region: Secondary | ICD-10-CM | POA: Diagnosis not present

## 2017-04-12 ENCOUNTER — Ambulatory Visit (INDEPENDENT_AMBULATORY_CARE_PROVIDER_SITE_OTHER): Payer: Medicare Other | Admitting: Sports Medicine

## 2017-04-12 ENCOUNTER — Encounter (INDEPENDENT_AMBULATORY_CARE_PROVIDER_SITE_OTHER): Payer: Self-pay

## 2017-04-12 DIAGNOSIS — B351 Tinea unguium: Secondary | ICD-10-CM

## 2017-04-12 DIAGNOSIS — M79676 Pain in unspecified toe(s): Secondary | ICD-10-CM | POA: Diagnosis not present

## 2017-04-12 DIAGNOSIS — E1140 Type 2 diabetes mellitus with diabetic neuropathy, unspecified: Secondary | ICD-10-CM

## 2017-04-12 NOTE — Progress Notes (Signed)
Subjective: [name] is a 82 y.o. male patient with history of diabetes who presents to office today complaining of long, painful nails  while ambulating in shoes; unable to trim. Patient states that the glucose reading this morning was not recorded.  Patient denies any other new changes in medication or new problems.  Patient to go see his PCP again on 04-21-17 for 3 month visit. Last A1c was 5.8.   Patient Active Problem List   Diagnosis Date Noted  . S/P lumbar laminectomy 09/12/2014  . Prostate cancer ([place]) 03/10/2011  . Hypertension 03/10/2011  . High cholesterol 03/10/2011  . Kidney stones 03/10/2011  . Diabetic neuropathy ([place]) 03/10/2011  . Neuritis due to diabetes mellitus ([place]) 03/10/2011   Current Outpatient Prescriptions on File Prior to Visit  Medication Sig Dispense Refill  . acetaminophen (TYLENOL) 500 MG tablet Take 1,000 mg by mouth at bedtime as needed (pain).    Marland Kitchen acetaminophen (TYLENOL) 500 MG tablet Take 1,000 mg by mouth.    Marland Kitchen amLODipine (NORVASC) 5 MG tablet Take 5 mg by mouth at bedtime.     Marland Kitchen atorvastatin (LIPITOR) 10 MG tablet Take 10 mg by mouth at bedtime.     Marland Kitchen azithromycin (ZITHROMAX) 500 MG tablet     . cefUROXime (CEFTIN) 500 MG tablet     . citalopram (CELEXA) 40 MG tablet Take 40 mg by mouth daily.     Marland Kitchen gabapentin (NEURONTIN) 300 MG capsule Take 600 mg by mouth 2 (two) times daily.     . hydrALAZINE (APRESOLINE) 25 MG tablet Take 50 mg by mouth 2 (two) times daily.     Marland Kitchen HYDROcodone-acetaminophen (NORCO) 10-325 MG per tablet Take 1 tablet by mouth every 4 (four) hours as needed for moderate pain. 45 tablet 0  . HYDROcodone-acetaminophen (NORCO/VICODIN) 5-325 MG per tablet     . insulin glargine (LANTUS) 100 UNIT/ML injection Inject 15 Units into the skin.     Marland Kitchen insulin lispro (HUMALOG) 100 UNIT/ML injection Inject 5-20 Units into the skin 3 (three) times daily before meals. Based on sliding scale    . levofloxacin (LEVAQUIN) 750 MG tablet     .  lisinopril (PRINIVIL,ZESTRIL) 40 MG tablet Take 40 mg by mouth at bedtime.     Marland Kitchen MAGNESIUM PO Take 1 tablet by mouth 2 (two) times daily.    . Misc Natural Products (OSTEO BI-FLEX TRIPLE STRENGTH) TABS Take 1 tablet by mouth 2 (two) times daily.    . naproxen sodium (ANAPROX) 220 MG tablet Take 440 mg by mouth at bedtime as needed (pain).    . Omega-3 Fatty Acids (FISH OIL PO) Take 1 capsule by mouth 2 (two) times daily.    . predniSONE (DELTASONE) 10 MG tablet     . temazepam (RESTORIL) 30 MG capsule Take 30 mg by mouth at bedtime as needed for sleep.     . valsartan-hydrochlorothiazide (DIOVAN-HCT) 320-25 MG per tablet Take 1 tablet by mouth daily.     . valsartan-hydrochlorothiazide (DIOVAN-HCT) 320-25 MG per tablet     . VENTOLIN HFA 108 (90 BASE) MCG/ACT inhaler      No current facility-administered medications on file prior to visit.    Allergies  Allergen Reactions  . Oxycodone-Acetaminophen Anxiety and Other (See Comments)    NERVOUS AND SHAKY     No results found for this or any previous visit (from the past 2160 hour(s)).  Objective: General: Patient is awake, alert, and oriented x 3 and in no acute distress.  Integument: Skin is warm, dry and supple bilateral. Nails are tender, long, thickened and dystrophic with subungual debris, consistent with onychomycosis, 1-5 bilateral. No signs of infection. No open lesions or preulcerative lesions present bilateral. Remaining integument unremarkable.  Vasculature:  Dorsalis Pedis pulse 1/4 bilateral. Posterior Tibial pulse  1/4 bilateral. Capillary fill time <5 sec 1-5 bilateral. No hair growth to the level of the digits.Temperature gradient within normal limits. Mild varicosities present bilateral. No edema present bilateral.   Neurology: The patient has intact sensation measured with a 5.07/10g Semmes Weinstein Monofilament at all pedal sites bilateral . Vibratory sensation diminished bilateral with tuning fork. No Babinski sign  present bilateral.   Musculoskeletal: Asymptomatic hammertoe pedal deformities noted bilateral. Muscular strength 5/5 in all lower extremity muscular groups bilateral without pain on range of motion . No tenderness with calf compression bilateral.  Assessment and Plan: Problem List Items Addressed This Visit    None    Visit Diagnoses    Pain due to onychomycosis of toenail    -  Primary   Type 2 diabetes, controlled, with neuropathy ([place])         -Examined patient. -Discussed and educated patient on diabetic foot care, especially with  regards to the vascular, neurological and musculoskeletal systems.  -Stressed the importance of good glycemic control and the detriment of not  controlling glucose levels in relation to the foot. -Mechanically debrided all nails 1-5 bilateral using sterile nail nipper and filed with dremel without incident  -Answered all patient questions -Patient to return  in 3 months for at risk foot care -Patient advised to call the office if any problems or questions arise in the meantime.  Landis Martins, DPM

## 2017-04-19 DIAGNOSIS — M1712 Unilateral primary osteoarthritis, left knee: Secondary | ICD-10-CM | POA: Diagnosis not present

## 2017-04-19 DIAGNOSIS — M25562 Pain in left knee: Secondary | ICD-10-CM | POA: Diagnosis not present

## 2017-04-19 DIAGNOSIS — M25561 Pain in right knee: Secondary | ICD-10-CM | POA: Diagnosis not present

## 2017-04-19 DIAGNOSIS — M170 Bilateral primary osteoarthritis of knee: Secondary | ICD-10-CM | POA: Diagnosis not present

## 2017-04-21 DIAGNOSIS — E785 Hyperlipidemia, unspecified: Secondary | ICD-10-CM | POA: Diagnosis not present

## 2017-04-21 DIAGNOSIS — E1142 Type 2 diabetes mellitus with diabetic polyneuropathy: Secondary | ICD-10-CM | POA: Diagnosis not present

## 2017-04-21 DIAGNOSIS — D638 Anemia in other chronic diseases classified elsewhere: Secondary | ICD-10-CM | POA: Diagnosis not present

## 2017-04-24 DIAGNOSIS — Z6834 Body mass index (BMI) 34.0-34.9, adult: Secondary | ICD-10-CM | POA: Diagnosis not present

## 2017-04-24 DIAGNOSIS — E785 Hyperlipidemia, unspecified: Secondary | ICD-10-CM | POA: Diagnosis not present

## 2017-04-24 DIAGNOSIS — E1142 Type 2 diabetes mellitus with diabetic polyneuropathy: Secondary | ICD-10-CM | POA: Diagnosis not present

## 2017-04-24 DIAGNOSIS — N184 Chronic kidney disease, stage 4 (severe): Secondary | ICD-10-CM | POA: Diagnosis not present

## 2017-04-24 DIAGNOSIS — Z9181 History of falling: Secondary | ICD-10-CM | POA: Diagnosis not present

## 2017-04-24 DIAGNOSIS — D638 Anemia in other chronic diseases classified elsewhere: Secondary | ICD-10-CM | POA: Diagnosis not present

## 2017-04-24 DIAGNOSIS — I10 Essential (primary) hypertension: Secondary | ICD-10-CM | POA: Diagnosis not present

## 2017-04-26 DIAGNOSIS — N184 Chronic kidney disease, stage 4 (severe): Secondary | ICD-10-CM | POA: Diagnosis not present

## 2017-04-26 DIAGNOSIS — N189 Chronic kidney disease, unspecified: Secondary | ICD-10-CM | POA: Diagnosis not present

## 2017-04-26 DIAGNOSIS — N281 Cyst of kidney, acquired: Secondary | ICD-10-CM | POA: Diagnosis not present

## 2017-04-27 DIAGNOSIS — D509 Iron deficiency anemia, unspecified: Secondary | ICD-10-CM | POA: Diagnosis not present

## 2017-04-27 DIAGNOSIS — D631 Anemia in chronic kidney disease: Secondary | ICD-10-CM | POA: Diagnosis not present

## 2017-04-27 DIAGNOSIS — N189 Chronic kidney disease, unspecified: Secondary | ICD-10-CM | POA: Diagnosis not present

## 2017-05-15 DIAGNOSIS — N184 Chronic kidney disease, stage 4 (severe): Secondary | ICD-10-CM | POA: Diagnosis not present

## 2017-05-15 DIAGNOSIS — I10 Essential (primary) hypertension: Secondary | ICD-10-CM | POA: Diagnosis not present

## 2017-05-15 DIAGNOSIS — N390 Urinary tract infection, site not specified: Secondary | ICD-10-CM | POA: Diagnosis not present

## 2017-05-15 DIAGNOSIS — R809 Proteinuria, unspecified: Secondary | ICD-10-CM | POA: Diagnosis not present

## 2017-05-17 DIAGNOSIS — I10 Essential (primary) hypertension: Secondary | ICD-10-CM | POA: Diagnosis not present

## 2017-05-17 DIAGNOSIS — N183 Chronic kidney disease, stage 3 (moderate): Secondary | ICD-10-CM | POA: Diagnosis not present

## 2017-05-17 DIAGNOSIS — R809 Proteinuria, unspecified: Secondary | ICD-10-CM | POA: Diagnosis not present

## 2017-05-24 DIAGNOSIS — Z23 Encounter for immunization: Secondary | ICD-10-CM | POA: Diagnosis not present

## 2017-05-24 DIAGNOSIS — I10 Essential (primary) hypertension: Secondary | ICD-10-CM | POA: Diagnosis not present

## 2017-05-24 DIAGNOSIS — D638 Anemia in other chronic diseases classified elsewhere: Secondary | ICD-10-CM | POA: Diagnosis not present

## 2017-05-24 DIAGNOSIS — E1142 Type 2 diabetes mellitus with diabetic polyneuropathy: Secondary | ICD-10-CM | POA: Diagnosis not present

## 2017-05-24 DIAGNOSIS — M4835 Traumatic spondylopathy, thoracolumbar region: Secondary | ICD-10-CM | POA: Diagnosis not present

## 2017-05-24 DIAGNOSIS — E785 Hyperlipidemia, unspecified: Secondary | ICD-10-CM | POA: Diagnosis not present

## 2017-05-24 DIAGNOSIS — N184 Chronic kidney disease, stage 4 (severe): Secondary | ICD-10-CM | POA: Diagnosis not present

## 2017-05-24 DIAGNOSIS — Z6835 Body mass index (BMI) 35.0-35.9, adult: Secondary | ICD-10-CM | POA: Diagnosis not present

## 2017-06-02 DIAGNOSIS — N183 Chronic kidney disease, stage 3 (moderate): Secondary | ICD-10-CM | POA: Diagnosis not present

## 2017-06-02 DIAGNOSIS — I10 Essential (primary) hypertension: Secondary | ICD-10-CM | POA: Diagnosis not present

## 2017-06-12 DIAGNOSIS — N184 Chronic kidney disease, stage 4 (severe): Secondary | ICD-10-CM | POA: Diagnosis not present

## 2017-06-12 DIAGNOSIS — N390 Urinary tract infection, site not specified: Secondary | ICD-10-CM | POA: Diagnosis not present

## 2017-06-12 DIAGNOSIS — I10 Essential (primary) hypertension: Secondary | ICD-10-CM | POA: Diagnosis not present

## 2017-06-15 DIAGNOSIS — Z0000 Encounter for general adult medical examination without abnormal findings: Secondary | ICD-10-CM | POA: Diagnosis not present

## 2017-06-15 DIAGNOSIS — Z9181 History of falling: Secondary | ICD-10-CM | POA: Diagnosis not present

## 2017-06-15 DIAGNOSIS — Z1331 Encounter for screening for depression: Secondary | ICD-10-CM | POA: Diagnosis not present

## 2017-06-15 DIAGNOSIS — Z6836 Body mass index (BMI) 36.0-36.9, adult: Secondary | ICD-10-CM | POA: Diagnosis not present

## 2017-06-15 DIAGNOSIS — E785 Hyperlipidemia, unspecified: Secondary | ICD-10-CM | POA: Diagnosis not present

## 2017-06-15 DIAGNOSIS — Z136 Encounter for screening for cardiovascular disorders: Secondary | ICD-10-CM | POA: Diagnosis not present

## 2017-06-15 DIAGNOSIS — Z125 Encounter for screening for malignant neoplasm of prostate: Secondary | ICD-10-CM | POA: Diagnosis not present

## 2017-06-15 DIAGNOSIS — E669 Obesity, unspecified: Secondary | ICD-10-CM | POA: Diagnosis not present

## 2017-06-21 DIAGNOSIS — Z6836 Body mass index (BMI) 36.0-36.9, adult: Secondary | ICD-10-CM | POA: Diagnosis not present

## 2017-06-21 DIAGNOSIS — J019 Acute sinusitis, unspecified: Secondary | ICD-10-CM | POA: Diagnosis not present

## 2017-06-26 DIAGNOSIS — Z6835 Body mass index (BMI) 35.0-35.9, adult: Secondary | ICD-10-CM | POA: Diagnosis not present

## 2017-06-26 DIAGNOSIS — J019 Acute sinusitis, unspecified: Secondary | ICD-10-CM | POA: Diagnosis not present

## 2017-06-26 DIAGNOSIS — J208 Acute bronchitis due to other specified organisms: Secondary | ICD-10-CM | POA: Diagnosis not present

## 2017-07-05 DIAGNOSIS — D509 Iron deficiency anemia, unspecified: Secondary | ICD-10-CM | POA: Diagnosis not present

## 2017-07-06 DIAGNOSIS — Z79899 Other long term (current) drug therapy: Secondary | ICD-10-CM | POA: Diagnosis not present

## 2017-07-06 DIAGNOSIS — M4316 Spondylolisthesis, lumbar region: Secondary | ICD-10-CM | POA: Diagnosis not present

## 2017-07-06 DIAGNOSIS — M47816 Spondylosis without myelopathy or radiculopathy, lumbar region: Secondary | ICD-10-CM | POA: Diagnosis not present

## 2017-07-06 DIAGNOSIS — Z79891 Long term (current) use of opiate analgesic: Secondary | ICD-10-CM | POA: Diagnosis not present

## 2017-07-06 DIAGNOSIS — G894 Chronic pain syndrome: Secondary | ICD-10-CM | POA: Diagnosis not present

## 2017-07-12 ENCOUNTER — Encounter: Payer: Self-pay | Admitting: Sports Medicine

## 2017-07-12 ENCOUNTER — Ambulatory Visit (INDEPENDENT_AMBULATORY_CARE_PROVIDER_SITE_OTHER): Payer: Medicare Other | Admitting: Sports Medicine

## 2017-07-12 DIAGNOSIS — M79676 Pain in unspecified toe(s): Secondary | ICD-10-CM

## 2017-07-12 DIAGNOSIS — B351 Tinea unguium: Secondary | ICD-10-CM

## 2017-07-12 DIAGNOSIS — E1140 Type 2 diabetes mellitus with diabetic neuropathy, unspecified: Secondary | ICD-10-CM

## 2017-07-12 NOTE — Progress Notes (Signed)
Subjective: [name] is a 82 y.o. male patient with history of diabetes who presents to office today complaining of long, painful nails  while ambulating in shoes; unable to trim. Patient states that the glucose reading this morning was 98  Will see PCP in 2 weeks, last time PCP stopped Lasix for kidneys and has noticed more swelling in legs.  Patient denies any other issues.    Patient Active Problem List   Diagnosis Date Noted  . S/P lumbar laminectomy 09/12/2014  . Prostate cancer ([place]) 03/10/2011  . Hypertension 03/10/2011  . High cholesterol 03/10/2011  . Kidney stones 03/10/2011  . Diabetic neuropathy ([place]) 03/10/2011  . Neuritis due to diabetes mellitus ([place]) 03/10/2011   Current Outpatient Medications on File Prior to Visit  Medication Sig Dispense Refill  . acetaminophen (TYLENOL) 500 MG tablet Take 1,000 mg by mouth at bedtime as needed (pain).    Marland Kitchen acetaminophen (TYLENOL) 500 MG tablet Take 1,000 mg by mouth.    Marland Kitchen amLODipine (NORVASC) 5 MG tablet Take 5 mg by mouth at bedtime.     Marland Kitchen atorvastatin (LIPITOR) 10 MG tablet Take 10 mg by mouth at bedtime.     Marland Kitchen azithromycin (ZITHROMAX) 500 MG tablet     . cefUROXime (CEFTIN) 500 MG tablet     . citalopram (CELEXA) 40 MG tablet Take 40 mg by mouth daily.     Marland Kitchen gabapentin (NEURONTIN) 300 MG capsule Take 600 mg by mouth 2 (two) times daily.     . hydrALAZINE (APRESOLINE) 25 MG tablet Take 50 mg by mouth 2 (two) times daily.     Marland Kitchen HYDROcodone-acetaminophen (NORCO) 10-325 MG per tablet Take 1 tablet by mouth every 4 (four) hours as needed for moderate pain. 45 tablet 0  . HYDROcodone-acetaminophen (NORCO/VICODIN) 5-325 MG per tablet     . insulin glargine (LANTUS) 100 UNIT/ML injection Inject 15 Units into the skin.     Marland Kitchen insulin lispro (HUMALOG) 100 UNIT/ML injection Inject 5-20 Units into the skin 3 (three) times daily before meals. Based on sliding scale    . levofloxacin (LEVAQUIN) 750 MG tablet     . lisinopril  (PRINIVIL,ZESTRIL) 40 MG tablet Take 40 mg by mouth at bedtime.     Marland Kitchen MAGNESIUM PO Take 1 tablet by mouth 2 (two) times daily.    . Misc Natural Products (OSTEO BI-FLEX TRIPLE STRENGTH) TABS Take 1 tablet by mouth 2 (two) times daily.    . naproxen sodium (ANAPROX) 220 MG tablet Take 440 mg by mouth at bedtime as needed (pain).    . Omega-3 Fatty Acids (FISH OIL PO) Take 1 capsule by mouth 2 (two) times daily.    . predniSONE (DELTASONE) 10 MG tablet     . temazepam (RESTORIL) 30 MG capsule Take 30 mg by mouth at bedtime as needed for sleep.     . valsartan-hydrochlorothiazide (DIOVAN-HCT) 320-25 MG per tablet Take 1 tablet by mouth daily.     . valsartan-hydrochlorothiazide (DIOVAN-HCT) 320-25 MG per tablet     . VENTOLIN HFA 108 (90 BASE) MCG/ACT inhaler      No current facility-administered medications on file prior to visit.    Allergies  Allergen Reactions  . Oxycodone-Acetaminophen Anxiety and Other (See Comments)    NERVOUS AND SHAKY     No results found for this or any previous visit (from the past 2160 hour(s)).  Objective: General: Patient is awake, alert, and oriented x 3 and in no acute distress.  Integument: Skin is  warm, dry and supple bilateral. Nails are tender, long, thickened and dystrophic with subungual debris, consistent with onychomycosis, 1-5 bilateral. No signs of infection. No open lesions or preulcerative lesions present bilateral. Remaining integument unremarkable.  Vasculature:  Dorsalis Pedis pulse 1/4 bilateral. Posterior Tibial pulse  0/4 bilateral. Capillary fill time <5 sec 1-5 bilateral. No hair growth to the level of the digits.Temperature gradient within normal limits. Mild varicosities present bilateral. 1+ pitting edema present bilateral.   Neurology: The patient has intact sensation measured with a 5.07/10g Semmes Weinstein Monofilament at all pedal sites bilateral . Vibratory sensation diminished bilateral with tuning fork. No Babinski sign present  bilateral.   Musculoskeletal: Asymptomatic hammertoe pedal deformities noted bilateral. Muscular strength 5/5 in all lower extremity muscular groups bilateral without pain on range of motion . No tenderness with calf compression bilateral.  Assessment and Plan: Problem List Items Addressed This Visit    None    Visit Diagnoses    Pain due to onychomycosis of toenail    -  Primary   Type 2 diabetes, controlled, with neuropathy ([place])         -Examined patient. -Discussed and educated patient on diabetic foot care, especially with  regards to the vascular, neurological and musculoskeletal systems.  -Stressed the importance of good glycemic control and the detriment of not  controlling glucose levels in relation to the foot. -Mechanically debrided all nails 1-5 bilateral using sterile nail nipper and filed with dremel without incident  -Recommend elevation in legs to assist with edema control and discussing swelling with PCP since lasix has been stopped -Answered all patient questions -Patient to return  in 3 months for at risk foot care -Patient advised to call the office if any problems or questions arise in the meantime.  Landis Martins, DPM

## 2017-07-14 DIAGNOSIS — I872 Venous insufficiency (chronic) (peripheral): Secondary | ICD-10-CM | POA: Diagnosis not present

## 2017-07-14 DIAGNOSIS — R600 Localized edema: Secondary | ICD-10-CM | POA: Diagnosis not present

## 2017-07-19 DIAGNOSIS — M1712 Unilateral primary osteoarthritis, left knee: Secondary | ICD-10-CM | POA: Diagnosis not present

## 2017-07-19 DIAGNOSIS — M21162 Varus deformity, not elsewhere classified, left knee: Secondary | ICD-10-CM | POA: Diagnosis not present

## 2017-07-19 DIAGNOSIS — M170 Bilateral primary osteoarthritis of knee: Secondary | ICD-10-CM | POA: Diagnosis not present

## 2017-07-19 DIAGNOSIS — M25562 Pain in left knee: Secondary | ICD-10-CM | POA: Diagnosis not present

## 2017-07-19 DIAGNOSIS — M25561 Pain in right knee: Secondary | ICD-10-CM | POA: Diagnosis not present

## 2017-07-25 DIAGNOSIS — M5416 Radiculopathy, lumbar region: Secondary | ICD-10-CM | POA: Diagnosis not present

## 2017-07-26 DIAGNOSIS — M1712 Unilateral primary osteoarthritis, left knee: Secondary | ICD-10-CM | POA: Diagnosis not present

## 2017-07-26 DIAGNOSIS — M25562 Pain in left knee: Secondary | ICD-10-CM | POA: Diagnosis not present

## 2017-07-28 DIAGNOSIS — Z6836 Body mass index (BMI) 36.0-36.9, adult: Secondary | ICD-10-CM | POA: Diagnosis not present

## 2017-07-28 DIAGNOSIS — N184 Chronic kidney disease, stage 4 (severe): Secondary | ICD-10-CM | POA: Diagnosis not present

## 2017-07-28 DIAGNOSIS — E1142 Type 2 diabetes mellitus with diabetic polyneuropathy: Secondary | ICD-10-CM | POA: Diagnosis not present

## 2017-07-28 DIAGNOSIS — R600 Localized edema: Secondary | ICD-10-CM | POA: Diagnosis not present

## 2017-07-28 DIAGNOSIS — E785 Hyperlipidemia, unspecified: Secondary | ICD-10-CM | POA: Diagnosis not present

## 2017-07-28 DIAGNOSIS — I10 Essential (primary) hypertension: Secondary | ICD-10-CM | POA: Diagnosis not present

## 2017-07-28 DIAGNOSIS — M48061 Spinal stenosis, lumbar region without neurogenic claudication: Secondary | ICD-10-CM | POA: Diagnosis not present

## 2017-07-28 DIAGNOSIS — D638 Anemia in other chronic diseases classified elsewhere: Secondary | ICD-10-CM | POA: Diagnosis not present

## 2017-08-24 DIAGNOSIS — M419 Scoliosis, unspecified: Secondary | ICD-10-CM | POA: Diagnosis not present

## 2017-08-24 DIAGNOSIS — M4716 Other spondylosis with myelopathy, lumbar region: Secondary | ICD-10-CM | POA: Diagnosis not present

## 2017-08-24 DIAGNOSIS — M4316 Spondylolisthesis, lumbar region: Secondary | ICD-10-CM | POA: Diagnosis not present

## 2017-08-24 DIAGNOSIS — M5136 Other intervertebral disc degeneration, lumbar region: Secondary | ICD-10-CM | POA: Diagnosis not present

## 2017-08-28 DIAGNOSIS — D509 Iron deficiency anemia, unspecified: Secondary | ICD-10-CM | POA: Diagnosis not present

## 2017-08-28 DIAGNOSIS — N189 Chronic kidney disease, unspecified: Secondary | ICD-10-CM | POA: Diagnosis not present

## 2017-08-28 DIAGNOSIS — D631 Anemia in chronic kidney disease: Secondary | ICD-10-CM | POA: Diagnosis not present

## 2017-09-06 DIAGNOSIS — I10 Essential (primary) hypertension: Secondary | ICD-10-CM | POA: Diagnosis not present

## 2017-09-06 DIAGNOSIS — H26492 Other secondary cataract, left eye: Secondary | ICD-10-CM | POA: Diagnosis not present

## 2017-09-06 DIAGNOSIS — H524 Presbyopia: Secondary | ICD-10-CM | POA: Diagnosis not present

## 2017-09-06 DIAGNOSIS — H5203 Hypermetropia, bilateral: Secondary | ICD-10-CM | POA: Diagnosis not present

## 2017-09-06 DIAGNOSIS — H26491 Other secondary cataract, right eye: Secondary | ICD-10-CM | POA: Diagnosis not present

## 2017-09-06 DIAGNOSIS — H52223 Regular astigmatism, bilateral: Secondary | ICD-10-CM | POA: Diagnosis not present

## 2017-09-06 DIAGNOSIS — E119 Type 2 diabetes mellitus without complications: Secondary | ICD-10-CM | POA: Diagnosis not present

## 2017-09-06 DIAGNOSIS — Z794 Long term (current) use of insulin: Secondary | ICD-10-CM | POA: Diagnosis not present

## 2017-09-06 DIAGNOSIS — Z961 Presence of intraocular lens: Secondary | ICD-10-CM | POA: Diagnosis not present

## 2017-09-06 DIAGNOSIS — H353131 Nonexudative age-related macular degeneration, bilateral, early dry stage: Secondary | ICD-10-CM | POA: Diagnosis not present

## 2017-09-12 DIAGNOSIS — N390 Urinary tract infection, site not specified: Secondary | ICD-10-CM | POA: Diagnosis not present

## 2017-09-12 DIAGNOSIS — N183 Chronic kidney disease, stage 3 (moderate): Secondary | ICD-10-CM | POA: Diagnosis not present

## 2017-09-12 DIAGNOSIS — I10 Essential (primary) hypertension: Secondary | ICD-10-CM | POA: Diagnosis not present

## 2017-09-12 DIAGNOSIS — N184 Chronic kidney disease, stage 4 (severe): Secondary | ICD-10-CM | POA: Diagnosis not present

## 2017-09-19 DIAGNOSIS — M4316 Spondylolisthesis, lumbar region: Secondary | ICD-10-CM | POA: Diagnosis not present

## 2017-09-19 DIAGNOSIS — M4716 Other spondylosis with myelopathy, lumbar region: Secondary | ICD-10-CM | POA: Diagnosis not present

## 2017-09-19 DIAGNOSIS — M419 Scoliosis, unspecified: Secondary | ICD-10-CM | POA: Diagnosis not present

## 2017-09-19 DIAGNOSIS — M5136 Other intervertebral disc degeneration, lumbar region: Secondary | ICD-10-CM | POA: Diagnosis not present

## 2017-09-20 DIAGNOSIS — Z6836 Body mass index (BMI) 36.0-36.9, adult: Secondary | ICD-10-CM | POA: Diagnosis not present

## 2017-09-20 DIAGNOSIS — N390 Urinary tract infection, site not specified: Secondary | ICD-10-CM | POA: Diagnosis not present

## 2017-10-11 ENCOUNTER — Ambulatory Visit (INDEPENDENT_AMBULATORY_CARE_PROVIDER_SITE_OTHER): Payer: Medicare Other | Admitting: Sports Medicine

## 2017-10-11 ENCOUNTER — Encounter: Payer: Self-pay | Admitting: Sports Medicine

## 2017-10-11 DIAGNOSIS — B351 Tinea unguium: Secondary | ICD-10-CM | POA: Diagnosis not present

## 2017-10-11 DIAGNOSIS — M79676 Pain in unspecified toe(s): Secondary | ICD-10-CM | POA: Diagnosis not present

## 2017-10-11 DIAGNOSIS — E1140 Type 2 diabetes mellitus with diabetic neuropathy, unspecified: Secondary | ICD-10-CM

## 2017-10-11 DIAGNOSIS — M5416 Radiculopathy, lumbar region: Secondary | ICD-10-CM | POA: Diagnosis not present

## 2017-10-11 NOTE — Progress Notes (Signed)
Subjective: [name] is a 83 y.o. male patient with history of diabetes who presents to office today complaining of long, painful nails  while ambulating in shoes; unable to trim. Patient states that the glucose reading this morning was 115. Saw PCP 12 weeks ago and will see him again this month.  Will see back doctor today. Patient denies any other issues.    Patient Active Problem List   Diagnosis Date Noted  . S/P lumbar laminectomy 09/12/2014  . Prostate cancer ([place]) 03/10/2011  . Hypertension 03/10/2011  . High cholesterol 03/10/2011  . Kidney stones 03/10/2011  . Diabetic neuropathy ([place]) 03/10/2011  . Neuritis due to diabetes mellitus ([place]) 03/10/2011   Current Outpatient Medications on File Prior to Visit  Medication Sig Dispense Refill  . acetaminophen (TYLENOL) 500 MG tablet Take 1,000 mg by mouth at bedtime as needed (pain).    Marland Kitchen acetaminophen (TYLENOL) 500 MG tablet Take 1,000 mg by mouth.    Marland Kitchen amLODipine (NORVASC) 5 MG tablet Take 5 mg by mouth at bedtime.     Marland Kitchen atorvastatin (LIPITOR) 10 MG tablet Take 10 mg by mouth at bedtime.     Marland Kitchen azithromycin (ZITHROMAX) 500 MG tablet     . cefUROXime (CEFTIN) 500 MG tablet     . citalopram (CELEXA) 40 MG tablet Take 40 mg by mouth daily.     Marland Kitchen gabapentin (NEURONTIN) 300 MG capsule Take 600 mg by mouth 2 (two) times daily.     . hydrALAZINE (APRESOLINE) 25 MG tablet Take 50 mg by mouth 2 (two) times daily.     Marland Kitchen HYDROcodone-acetaminophen (NORCO) 10-325 MG per tablet Take 1 tablet by mouth every 4 (four) hours as needed for moderate pain. 45 tablet 0  . HYDROcodone-acetaminophen (NORCO/VICODIN) 5-325 MG per tablet     . insulin glargine (LANTUS) 100 UNIT/ML injection Inject 15 Units into the skin.     Marland Kitchen insulin lispro (HUMALOG) 100 UNIT/ML injection Inject 5-20 Units into the skin 3 (three) times daily before meals. Based on sliding scale    . levofloxacin (LEVAQUIN) 750 MG tablet     . lisinopril (PRINIVIL,ZESTRIL) 40 MG tablet  Take 40 mg by mouth at bedtime.     Marland Kitchen MAGNESIUM PO Take 1 tablet by mouth 2 (two) times daily.    . Misc Natural Products (OSTEO BI-FLEX TRIPLE STRENGTH) TABS Take 1 tablet by mouth 2 (two) times daily.    . naproxen sodium (ANAPROX) 220 MG tablet Take 440 mg by mouth at bedtime as needed (pain).    . Omega-3 Fatty Acids (FISH OIL PO) Take 1 capsule by mouth 2 (two) times daily.    . predniSONE (DELTASONE) 10 MG tablet     . temazepam (RESTORIL) 30 MG capsule Take 30 mg by mouth at bedtime as needed for sleep.     . valsartan-hydrochlorothiazide (DIOVAN-HCT) 320-25 MG per tablet Take 1 tablet by mouth daily.     . valsartan-hydrochlorothiazide (DIOVAN-HCT) 320-25 MG per tablet     . VENTOLIN HFA 108 (90 BASE) MCG/ACT inhaler      No current facility-administered medications on file prior to visit.    Allergies  Allergen Reactions  . Oxycodone-Acetaminophen Anxiety and Other (See Comments)    NERVOUS AND SHAKY     No results found for this or any previous visit (from the past 2160 hour(s)).  Objective: General: Patient is awake, alert, and oriented x 3 and in no acute distress.  Integument: Skin is warm, dry and supple  bilateral. Nails are tender, long, thickened and dystrophic with subungual debris, consistent with onychomycosis, 1-5 bilateral. No signs of infection. No open lesions or preulcerative lesions present bilateral. Remaining integument unremarkable.  Vasculature:  Dorsalis Pedis pulse 1/4 bilateral. Posterior Tibial pulse  0/4 bilateral. Capillary fill time <5 sec 1-5 bilateral. No hair growth to the level of the digits.Temperature gradient within normal limits. Mild varicosities present bilateral. 1+ pitting edema present bilateral.   Neurology: The patient has intact sensation measured with a 5.07/10g Semmes Weinstein Monofilament at all pedal sites bilateral. Vibratory sensation diminished bilateral with tuning fork. No Babinski sign present bilateral.   Musculoskeletal:  Asymptomatic hammertoe pedal deformities noted bilateral. Muscular strength 5/5 in all lower extremity muscular groups bilateral without pain on range of motion . No tenderness with calf compression bilateral.  Assessment and Plan: Problem List Items Addressed This Visit    None    Visit Diagnoses    Pain due to onychomycosis of toenail    -  Primary   Type 2 diabetes, controlled, with neuropathy ([place])         -Examined patient. -Discussed and educated patient on diabetic foot care, especially with  regards to the vascular, neurological and musculoskeletal systems.  -Stressed the importance of good glycemic control and the detriment of not  controlling glucose levels in relation to the foot. -Mechanically debrided all nails 1-5 bilateral using sterile nail nipper and filed with dremel without incident  -Recommend continue with elevation in legs to assist with edema control  -Answered all patient questions -Patient to return  in 3 months for at risk foot care -Patient advised to call the office if any problems or questions arise in the meantime.  Landis Martins, DPM

## 2017-10-26 DIAGNOSIS — I10 Essential (primary) hypertension: Secondary | ICD-10-CM | POA: Diagnosis not present

## 2017-10-26 DIAGNOSIS — N183 Chronic kidney disease, stage 3 (moderate): Secondary | ICD-10-CM | POA: Diagnosis not present

## 2017-10-26 DIAGNOSIS — M25562 Pain in left knee: Secondary | ICD-10-CM | POA: Diagnosis not present

## 2017-10-26 DIAGNOSIS — E1142 Type 2 diabetes mellitus with diabetic polyneuropathy: Secondary | ICD-10-CM | POA: Diagnosis not present

## 2017-10-26 DIAGNOSIS — R600 Localized edema: Secondary | ICD-10-CM | POA: Diagnosis not present

## 2017-10-26 DIAGNOSIS — M1712 Unilateral primary osteoarthritis, left knee: Secondary | ICD-10-CM | POA: Diagnosis not present

## 2017-10-26 DIAGNOSIS — D638 Anemia in other chronic diseases classified elsewhere: Secondary | ICD-10-CM | POA: Diagnosis not present

## 2017-10-26 DIAGNOSIS — Z789 Other specified health status: Secondary | ICD-10-CM | POA: Diagnosis not present

## 2017-10-26 DIAGNOSIS — R269 Unspecified abnormalities of gait and mobility: Secondary | ICD-10-CM | POA: Diagnosis not present

## 2017-10-26 DIAGNOSIS — M25662 Stiffness of left knee, not elsewhere classified: Secondary | ICD-10-CM | POA: Diagnosis not present

## 2017-10-26 DIAGNOSIS — M25462 Effusion, left knee: Secondary | ICD-10-CM | POA: Diagnosis not present

## 2017-10-26 DIAGNOSIS — E785 Hyperlipidemia, unspecified: Secondary | ICD-10-CM | POA: Diagnosis not present

## 2017-10-26 DIAGNOSIS — M48061 Spinal stenosis, lumbar region without neurogenic claudication: Secondary | ICD-10-CM | POA: Diagnosis not present

## 2017-11-19 DIAGNOSIS — I129 Hypertensive chronic kidney disease with stage 1 through stage 4 chronic kidney disease, or unspecified chronic kidney disease: Secondary | ICD-10-CM | POA: Diagnosis not present

## 2017-11-19 DIAGNOSIS — R109 Unspecified abdominal pain: Secondary | ICD-10-CM | POA: Diagnosis not present

## 2017-11-19 DIAGNOSIS — F17290 Nicotine dependence, other tobacco product, uncomplicated: Secondary | ICD-10-CM | POA: Diagnosis present

## 2017-11-19 DIAGNOSIS — R61 Generalized hyperhidrosis: Secondary | ICD-10-CM | POA: Diagnosis not present

## 2017-11-19 DIAGNOSIS — Z794 Long term (current) use of insulin: Secondary | ICD-10-CM | POA: Diagnosis not present

## 2017-11-19 DIAGNOSIS — D6959 Other secondary thrombocytopenia: Secondary | ICD-10-CM | POA: Diagnosis present

## 2017-11-19 DIAGNOSIS — Z720 Tobacco use: Secondary | ICD-10-CM | POA: Diagnosis not present

## 2017-11-19 DIAGNOSIS — J159 Unspecified bacterial pneumonia: Secondary | ICD-10-CM | POA: Diagnosis present

## 2017-11-19 DIAGNOSIS — N183 Chronic kidney disease, stage 3 (moderate): Secondary | ICD-10-CM | POA: Diagnosis not present

## 2017-11-19 DIAGNOSIS — R7881 Bacteremia: Secondary | ICD-10-CM | POA: Diagnosis not present

## 2017-11-19 DIAGNOSIS — F17220 Nicotine dependence, chewing tobacco, uncomplicated: Secondary | ICD-10-CM | POA: Diagnosis present

## 2017-11-19 DIAGNOSIS — D696 Thrombocytopenia, unspecified: Secondary | ICD-10-CM | POA: Diagnosis not present

## 2017-11-19 DIAGNOSIS — I491 Atrial premature depolarization: Secondary | ICD-10-CM | POA: Diagnosis not present

## 2017-11-19 DIAGNOSIS — J9601 Acute respiratory failure with hypoxia: Secondary | ICD-10-CM | POA: Diagnosis not present

## 2017-11-19 DIAGNOSIS — Z6833 Body mass index (BMI) 33.0-33.9, adult: Secondary | ICD-10-CM | POA: Diagnosis not present

## 2017-11-19 DIAGNOSIS — R05 Cough: Secondary | ICD-10-CM | POA: Diagnosis not present

## 2017-11-19 DIAGNOSIS — I440 Atrioventricular block, first degree: Secondary | ICD-10-CM | POA: Diagnosis not present

## 2017-11-19 DIAGNOSIS — J441 Chronic obstructive pulmonary disease with (acute) exacerbation: Secondary | ICD-10-CM | POA: Diagnosis present

## 2017-11-19 DIAGNOSIS — D649 Anemia, unspecified: Secondary | ICD-10-CM | POA: Diagnosis not present

## 2017-11-19 DIAGNOSIS — R0902 Hypoxemia: Secondary | ICD-10-CM | POA: Diagnosis not present

## 2017-11-19 DIAGNOSIS — F17200 Nicotine dependence, unspecified, uncomplicated: Secondary | ICD-10-CM | POA: Diagnosis not present

## 2017-11-19 DIAGNOSIS — Z716 Tobacco abuse counseling: Secondary | ICD-10-CM | POA: Diagnosis not present

## 2017-11-19 DIAGNOSIS — R0602 Shortness of breath: Secondary | ICD-10-CM | POA: Diagnosis not present

## 2017-11-19 DIAGNOSIS — R1310 Dysphagia, unspecified: Secondary | ICD-10-CM | POA: Diagnosis not present

## 2017-11-19 DIAGNOSIS — E1165 Type 2 diabetes mellitus with hyperglycemia: Secondary | ICD-10-CM | POA: Diagnosis present

## 2017-11-19 DIAGNOSIS — J440 Chronic obstructive pulmonary disease with acute lower respiratory infection: Secondary | ICD-10-CM | POA: Diagnosis present

## 2017-11-19 DIAGNOSIS — I4510 Unspecified right bundle-branch block: Secondary | ICD-10-CM | POA: Diagnosis not present

## 2017-11-19 DIAGNOSIS — R062 Wheezing: Secondary | ICD-10-CM | POA: Diagnosis not present

## 2017-11-19 DIAGNOSIS — J189 Pneumonia, unspecified organism: Secondary | ICD-10-CM | POA: Diagnosis not present

## 2017-11-19 DIAGNOSIS — Z8546 Personal history of malignant neoplasm of prostate: Secondary | ICD-10-CM | POA: Diagnosis not present

## 2017-11-19 DIAGNOSIS — E1122 Type 2 diabetes mellitus with diabetic chronic kidney disease: Secondary | ICD-10-CM | POA: Diagnosis not present

## 2017-11-19 DIAGNOSIS — R110 Nausea: Secondary | ICD-10-CM | POA: Diagnosis not present

## 2017-11-29 DIAGNOSIS — Z87891 Personal history of nicotine dependence: Secondary | ICD-10-CM | POA: Diagnosis not present

## 2017-11-29 DIAGNOSIS — J189 Pneumonia, unspecified organism: Secondary | ICD-10-CM | POA: Diagnosis not present

## 2017-11-29 DIAGNOSIS — J441 Chronic obstructive pulmonary disease with (acute) exacerbation: Secondary | ICD-10-CM | POA: Diagnosis not present

## 2017-11-29 DIAGNOSIS — F17200 Nicotine dependence, unspecified, uncomplicated: Secondary | ICD-10-CM | POA: Diagnosis not present

## 2017-11-29 DIAGNOSIS — F5104 Psychophysiologic insomnia: Secondary | ICD-10-CM | POA: Diagnosis not present

## 2017-12-06 ENCOUNTER — Other Ambulatory Visit: Payer: Self-pay | Admitting: *Deleted

## 2017-12-06 NOTE — Patient Outreach (Signed)
[place] [facility]) Care Management  12/06/2017  Doc Mandala 1935/07/09 [id number]   While meeting with patient wife at [facility] regarding [facility] care management, she is at the facility for short term rehab, wife wants to refer her spouse for [facility] care management.  She reports patient is home, he was just discharged from [facility] due to pneumonia. Patient also has diabetes.  Wife reports patient should of came to a facility but went home instead. She feels patient could benefit from [facility] care management and possible PT.  She would like for a nurse and SW to follow up with spouse.  Patient has been coming out to facility to visit with her almost everyday. Wife has cell number of [phone] if patient cannot be reached at home.  Patient does have assistance from 2 nieces, patient and wife had one child, who has passed so these are the only support system at this time.   Verified patient is eligible for [facility] program services.   Plan to make referral to [facility] LCSW to assess for placement needs and RNCM for community care management.  Royetta Crochet. Laymond Purser, RN, BSN, [place] [phone]) Business Cell  [phone]) Toll Free Office

## 2017-12-08 ENCOUNTER — Other Ambulatory Visit: Payer: Self-pay | Admitting: *Deleted

## 2017-12-08 ENCOUNTER — Encounter: Payer: Self-pay | Admitting: *Deleted

## 2017-12-08 NOTE — Patient Outreach (Signed)
[place] [facility]) Care Management  12/08/2017  Christhoper Busbee 07/26/1935 [id number]   CSW attempted to reach pt today by phone but was unsuccessful after several phone attempts. [facility] RNCM is also attempting initial outreach.  CSW will send unsuccessful outreach letter and try again in the next 1-3 business days.   Eduard Clos, MSW, [place] Worker  [place] [phone]

## 2017-12-08 NOTE — Patient Outreach (Signed)
[place] [facility]) Care Management  12/08/2017  [name] [birth year] [id number]   Referral received 12/06/17 Referral reason : Recent discharge from [facility] , Dx: Pneumonia Referral source : Referral from patient spouse.  Insurance : Medicare  Transition of care is done by PCP office  Placed call to patient, explained purpose of the call , HIPAA information verified.  Social   Patient discussed that his wife is currently in rehab in [place] at [facility]. Patient discussed he is currently staying a home alone, his nieces have been helping him out at home. Patient states he has been eating out a little more often. Patient is able to drive self to appointments and visits wife daily at rehab.  Patient share that he has someone coming in to clean house also.   Conditions  Patient discussed his recent hospital admission at [facility] for Pneumonia, he is unable to recall length of stay in hospital or when he was discharged, but he is able to recall getting out on Sunday and and he went for office visit with Dr. Delena Bali in the last week. Patient reports having a nebulizer and doing treatment twice daily . Patient denies increase in cough, sputum production or fever, reports breathing and cough have improved. Patient reports that he still smokes and he is 83 yrs old and trying to quit.  Patient reports that he has Diabetes checking his blood sugar at least 3 times a day and taking insulin as prescribed( per record Alc 5.5 on 10/26/17, blood sugar this am 101, recall recent reading of blood sugar of 70, he ate some candy, rechecked it and it came up to 100 then he ate a sandwich.  Patient reports having high blood pressure  Has monitor to check readings daily unable to recall recent reading. Patient reports that he has bad knees and back and not able to get surgery due to chronic kidney disease.   Medication  Patient reports no cost concern related to medication, he  reports he manages his own medication, taking them from the bottle. Patient shared that he has a pill organizer he doesn't use , he just lines his medication up on the cabinet.   Falls Patient discussed he had a fall the other day at night states he just tripped, denies injury and able to get himself up without problem. Patient reports using a cane for mobility. Patient unable to review medications at this time. Patient declines reviewing medications that he takes at this time .   Advanced Directive Patient reports he has filled out paperwork he just needs to get it notarized.   Appointments Patient has attended post discharge visit with PCP, has follow up CXR in 2 weeks   Consents Explained and offered [facility] services, patient agrees to [facility] services and home visit for further assessment of care needs.   Plan  Will mark case as active, patient agreeable to [facility] care coordinator home visit in the next week.  Will send MD barrier involvement letter and successful outreach letter to patient.  Provided patient to [facility] care coordinator contact information .    [facility] CM Care Plan Problem One     Most Recent Value  Care Plan Problem One  Recent hospital admission related to Pneumonia   Role Documenting the Problem One  Care Management [place] for Problem One  Active  [facility] Long Term Goal   Patient will not experience a hospital readmission in the next 60  days   [facility]N Long Term Goal Start Date  12/08/17  Interventions for Problem One Long Term Goal  Advised patient regarding taking all medications as prescribed, keeping all medical follow up appointments, notiying MD office sooner for worsening symptom concern to schedule office visit   [facility] CM Short Term Goal #1   Patient will be able to state sign/symptoms of pneumonia and action plan in the next 30 days   [facility] CM Short Term Goal #1 Start Date  12/08/17  Interventions for Short Term Goal #1  Educated patient on the signs  of pneumonia , coughing, shortness of breath , fever, increased sputum that is green , gray, yellow in color, advised notifying MD sooner of these symptoms .         Joylene Draft, RN, [place] Management Coordinator  [phone]- Mobile [phone]- Toll Free Main Office

## 2017-12-12 ENCOUNTER — Other Ambulatory Visit: Payer: Self-pay | Admitting: *Deleted

## 2017-12-12 DIAGNOSIS — I10 Essential (primary) hypertension: Secondary | ICD-10-CM | POA: Diagnosis not present

## 2017-12-12 DIAGNOSIS — N184 Chronic kidney disease, stage 4 (severe): Secondary | ICD-10-CM | POA: Diagnosis not present

## 2017-12-12 NOTE — Patient Outreach (Signed)
[place] [facility]) Care Management  12/12/2017  Brandis Matsuura [birth year] [id number]    CSW was able to make initial contact with patient today.  CSW obtained two HIPAA compliant identifiers from patient, which included patient's name and date of birth. CSW introduced self, explained role and types of services provided through [place] Management ([place] Management).  CSW further explained to patient that CSW works with patient's RNCM, also with [place] Management.  "she's coming out to see me Friday".   Patient reports he is independent; drives, goes to "eat at the diner a lot", and visits wife who is at [facility] rehab.  He also shared he has 2 nieces and a housekeeper that help out in the home.   Pt denies any needs from social work at this time.  CSW will ask [facility] RNCM to advise if needs are identified at home visit Friday.  CSW will plan f/u call to pt in the next week for further inquiry and if no needs will plan CSW case closure.   Eduard Clos, MSW, LCSW Clinical Social Worker  [place], MSW, [place] Worker  [place] [phone]

## 2017-12-15 ENCOUNTER — Other Ambulatory Visit: Payer: Self-pay | Admitting: *Deleted

## 2017-12-15 NOTE — Patient Outreach (Addendum)
[place] [facility]) Care Management   12/15/2017  [name] 1935-02-17 [id number]  [name] is an 83 y.o. male  Subjective:   Patient discussed got a late start on today, slept in a little longer.  Patient discussed recent admission related to Pneumonia and states he is doing much better and had a worse case on last year.   Patient discussed his blood this morning was low at 65, reports having blurry vision, discussed that was the first time in a while, reports he keeps mini candy bars on hand and he ate 2 this morning, and felt better did not recheck blood sugar. Patient reports that it has been a while since he had a low blood sugar reading.   Patient discussed eating out a little more since is wife has been in rehab, states I know some of the foods at [facility] has more salt in it, but he does not add any.     Objective: BP 128/74 (BP Location: Left Arm, Patient Position: Sitting, Cuff Size: Large)   Pulse (!) 50   Resp 18   Ht 1.803 m (5\' 11" )   Wt 228 lb (103.4 kg)   SpO2 98%   BMI 31.80 kg/m   Review of Systems  HENT:       Hard of hearing right ear   Eyes: Negative.   Respiratory: Negative.   Cardiovascular: Negative.   Gastrointestinal: Negative.   Genitourinary: Negative.   Musculoskeletal: Negative.   Neurological: Negative.   Endo/Heme/Allergies: Bruises/bleeds easily.  Psychiatric/Behavioral: Negative.     Physical Exam  Constitutional: He is oriented to person, place, and time. He appears well-developed and well-nourished.  Cardiovascular: Normal rate and normal heart sounds.  Respiratory: Effort normal.  GI: Soft.  Neurological: He is alert and oriented to person, place, and time.  Skin: Skin is warm and dry.  Dark bluish/dark colored area bilateral arms and leg, skin intact. Patient reports area are old.   Psychiatric: He has a normal mood and affect. His behavior is normal. Judgment and thought content normal.    Encounter  Medications:   Outpatient Encounter Medications as of 12/15/2017  Medication Sig Note  . acetaminophen (TYLENOL) 500 MG tablet Take 1,000 mg by mouth at bedtime as needed (pain).   Marland Kitchen atorvastatin (LIPITOR) 10 MG tablet Take 10 mg by mouth at bedtime.  08/28/2013: Received from: External Pharmacy  . gabapentin (NEURONTIN) 300 MG capsule Take 600 mg by mouth 2 (two) times daily.  03/05/2014: Received from: [facility]  . hydrALAZINE (APRESOLINE) 25 MG tablet Take 50 mg by mouth 3 (three) times daily.  08/28/2013: Received from: External Pharmacy  . insulin glargine (LANTUS) 100 UNIT/ML injection Inject 10 Units into the skin.  03/05/2014: Received from: [facility]  . insulin lispro (HUMALOG) 100 UNIT/ML injection Inject 10-12 Units into the skin 3 (three) times daily before meals. Based on sliding scale    . temazepam (RESTORIL) 30 MG capsule Take 30 mg by mouth at bedtime as needed for sleep.  11/27/2013: Received from: External Pharmacy  . acetaminophen (TYLENOL) 500 MG tablet Take 1,000 mg by mouth. 02/23/2015: Received from: [facility]  . amLODipine (NORVASC) 5 MG tablet Take 5 mg by mouth at bedtime.  08/28/2013: Received from: External Pharmacy  . azithromycin (ZITHROMAX) 500 MG tablet  02/23/2015: Received from: External Pharmacy  . cefUROXime (CEFTIN) 500 MG tablet  02/23/2015: Received from: External Pharmacy  . citalopram (CELEXA) 40 MG tablet Take 40 mg  by mouth daily.  08/28/2013: Received from: External Pharmacy  . HYDROcodone-acetaminophen (NORCO) 10-325 MG per tablet Take 1 tablet by mouth every 4 (four) hours as needed for moderate pain. (Patient not taking: Reported on 12/15/2017)   . HYDROcodone-acetaminophen (NORCO/VICODIN) 5-325 MG per tablet  11/20/2014: Received from: External Pharmacy  . levofloxacin (LEVAQUIN) 750 MG tablet  02/23/2015: Received from: External Pharmacy  . lisinopril (PRINIVIL,ZESTRIL) 40 MG tablet Take 40 mg by mouth at bedtime.  03/05/2014: Received from: [facility]  . MAGNESIUM PO Take 1 tablet by mouth 2 (two) times daily.   . Misc Natural Products (OSTEO BI-FLEX TRIPLE STRENGTH) TABS Take 1 tablet by mouth 2 (two) times daily.   . naproxen sodium (ANAPROX) 220 MG tablet Take 440 mg by mouth at bedtime as needed (pain). 09/08/2014: On hold until after procedure  . Omega-3 Fatty Acids (FISH OIL PO) Take 1 capsule by mouth 2 (two) times daily.   . predniSONE (DELTASONE) 10 MG tablet  02/23/2015: Received from: External Pharmacy  . valsartan-hydrochlorothiazide (DIOVAN-HCT) 320-25 MG per tablet Take 1 tablet by mouth daily.  06/02/2014: Received from: External Pharmacy  . valsartan-hydrochlorothiazide (DIOVAN-HCT) 320-25 MG per tablet  02/23/2015: Received from: [facility]  . VENTOLIN HFA 108 (90 BASE) MCG/ACT inhaler  02/23/2015: Received from: External Pharmacy   No facility-administered encounter medications on file as of 12/15/2017.     Functional Status:   In your present state of health, do you have any difficulty performing the following activities: 12/15/2017  Hearing? Y  Comment right ear  Vision? N  Difficulty concentrating or making decisions? N  Walking or climbing stairs? Y  Dressing or bathing? N  Doing errands, shopping? N  Preparing Food and eating ? Y  Comment neices assisting now or eating out   Using the Toilet? N  In the past six months, have you accidently leaked urine? N  Do you have problems with loss of bowel control? N  Managing your Medications? N  Managing your Finances? N  Housekeeping or managing your Housekeeping? Y  Comment neices assisting, and has hired help  Some recent data might be hidden    Fall/Depression Screening:    Fall Risk  12/15/2017  Falls in the past year? Yes  Number falls in past yr: 1  Injury with Fall? No  Risk for fall due to : Impaired balance/gait  Follow up Falls prevention discussed   PHQ 2/9 Scores 12/08/2017  PHQ - 2 Score 1    Assessment:  Initial home visit.   Referral received 12/06/17 Referral reason : Recent discharge from [facility] , Dx: Pneumonia Referral source : Referral from patient spouse.  Insurance : Medicare PMHx: includes but not limited to Diabetes, hypertension, CKD, Peripheral Neuropathy.  Pneumonia continuing respiratory treatments as prescribed, continues cigarette smoking, denies wanting to quit.  Diabetes Monitor reviewed, 30 day average 138, noted on episode of low blood sugar in last 3 weeks. Needs review on rule of 15 for treating low blood sugar. Blood sugar rechecked during visit 85, encouraged regarding eating meal. Reports he usually rechecks blood sugar after treating.  Hypertension  Self monitors blood pressure at home eating out  more recently,usually watches salt in diet. Reading at home 130/70 range, reports heart rate usually runs in the 50 range, no dizziness.  Recent fall - recent fall at night when light off in bedroom, will benefit from fall prevention education measures.  Medications Patient was recently discharged from hospital and  all medications have been reviewed. Patient takes daily medication out of bottle daily, he had duplicate bottles of medications all in date on hand. Understands what medications are for, and states he knows what to take.    Plan:  Provided and reviewed welcome packet, and [facility] calendar.  Reviewed rule of 15 for hypoglycemia, reinforced  importance of rechecking reading and notifying MD of increase in episodes.  EMMI on preventing falls in elderly .  Will send  PCP visit note,  Will plan follow up call in the next 2 weeks.   [facility] CM Care Plan Problem One     Most Recent Value  Care Plan Problem One  Recent hospital admission related to Pneumonia   Role Documenting the Problem One  Care Management [place] for Problem One  Active  THN Long Term Goal   Patient will not experience a hospital readmission in the next 60 days   THN Long Term Goal Start Date   12/08/17  Interventions for Problem One Long Term Goal  Home visit completed, discussed with patient taking medication as prescribed, scheduling follow up chest xray.   [facility] CM Short Term Goal #1   Patient will be able to state sign/symptoms of pneumonia and action plan in the next 30 days   [facility]N CM Short Term Goal #1 Start Date  12/08/17  Interventions for Short Term Goal #1  Reviewed signs/ symptoms of pneumonia with teachback, reinforced sooner notifcaiton to PCP if symptoms arise   THN CM Short Term Goal #2   Patient will report increased knowledge of fall prevention in the next 30 days   [facility]N CM Short Term Goal #2 Start Date  12/15/17  Interventions for Short Term Goal #2  Provided and reviewed EMMI on preventing falls in the elderly , reinforeced using cane, keeping light on at night. home safety evaluation , encouraged wearing footwear with nonslip bottom on slipperly hardwood.        Joylene Draft, RN, [place] Management Coordinator  [phone]- Mobile [phone]- Toll Free Main Office

## 2017-12-18 ENCOUNTER — Ambulatory Visit: Payer: Self-pay | Admitting: *Deleted

## 2017-12-19 ENCOUNTER — Encounter: Payer: Self-pay | Admitting: *Deleted

## 2017-12-20 DIAGNOSIS — N183 Chronic kidney disease, stage 3 (moderate): Secondary | ICD-10-CM | POA: Diagnosis not present

## 2017-12-25 DIAGNOSIS — R300 Dysuria: Secondary | ICD-10-CM | POA: Diagnosis not present

## 2017-12-26 ENCOUNTER — Other Ambulatory Visit: Payer: Self-pay | Admitting: *Deleted

## 2017-12-26 ENCOUNTER — Encounter: Payer: Self-pay | Admitting: *Deleted

## 2017-12-26 DIAGNOSIS — D631 Anemia in chronic kidney disease: Secondary | ICD-10-CM | POA: Diagnosis not present

## 2017-12-26 DIAGNOSIS — E11649 Type 2 diabetes mellitus with hypoglycemia without coma: Secondary | ICD-10-CM | POA: Diagnosis not present

## 2017-12-26 DIAGNOSIS — N189 Chronic kidney disease, unspecified: Secondary | ICD-10-CM | POA: Diagnosis not present

## 2017-12-26 DIAGNOSIS — D509 Iron deficiency anemia, unspecified: Secondary | ICD-10-CM | POA: Diagnosis not present

## 2017-12-26 NOTE — Patient Outreach (Addendum)
[place] [facility]) Care Management  12/26/2017  [name] 02/05/1935 [id number]   CSW spoke with pt's wife who is home from SNF and reports she is getting stronger as is pt who had pneumonia per her report.  CSW discussed role with her again and she does not foresee any needs or concerns at this time for CSW.   CSW will plan to close CSW consult and will advise PCP and [facility] team. Wife advised to call or request from RN or PCP if needs arise.     Eduard Clos, MSW, [place] Worker  [place] [phone]

## 2017-12-29 ENCOUNTER — Other Ambulatory Visit: Payer: Self-pay | Admitting: *Deleted

## 2017-12-29 NOTE — Patient Outreach (Signed)
[place] [facility]) Care Management  12/29/2017  [name] [birth year] [id number]  Telephone follow up call   Referral received 12/06/17 Referral reason : Recent discharge from [facility] , Dx: Pneumonia Referral source : Referral from patient spouse.  Insurance : Medicare  [phone] Outreach call to patient, person answering phone states he is not available to try later.    Return call to patient, HIPPA verified. Patient states he is doing fairly well, reports he has a UTI complained with burning with urination, saw his PCP on this week and has been prescribed antibiotics that he is still taking  and instructed if he still had symptoms to make an appointment with his urologist, patient discussed placing call to office to arrange a visit, no answer yet he will continue to try.   Patient discussed his breathing is doing well , coughing decreased and he continue to use nebulizer for treatments, he has not scheduled follow up xray yet because of other appointments.  Patient discussed he has visit with oncology office on this week and they have called him results and included that his glucose level was low and they were notifying [place] office, he states he did not eat prior to going for lab work.  Patient discussed his blood sugar was 100 on this am, and he continues taking Lantus twice daily 10 units and sliding scale that he has not required lately.  Patient denies having symptoms of low blood sugar on this week but is able to state how to treat and recheck.    Joylene Draft, RN, [place] Management Coordinator  [phone]- Mobile [phone]- Toll Free Main Office

## 2018-01-02 DIAGNOSIS — R300 Dysuria: Secondary | ICD-10-CM | POA: Diagnosis not present

## 2018-01-02 DIAGNOSIS — R8290 Unspecified abnormal findings in urine: Secondary | ICD-10-CM | POA: Diagnosis not present

## 2018-01-10 ENCOUNTER — Encounter: Payer: Self-pay | Admitting: Sports Medicine

## 2018-01-10 ENCOUNTER — Ambulatory Visit (INDEPENDENT_AMBULATORY_CARE_PROVIDER_SITE_OTHER): Payer: Medicare Other | Admitting: Sports Medicine

## 2018-01-10 DIAGNOSIS — E1140 Type 2 diabetes mellitus with diabetic neuropathy, unspecified: Secondary | ICD-10-CM

## 2018-01-10 DIAGNOSIS — B351 Tinea unguium: Secondary | ICD-10-CM

## 2018-01-10 DIAGNOSIS — M79676 Pain in unspecified toe(s): Secondary | ICD-10-CM | POA: Diagnosis not present

## 2018-01-10 NOTE — Progress Notes (Signed)
Subjective: [name] is a 83 y.o. male patient with history of diabetes who presents to office today complaining of long, painful nails  while ambulating in shoes; unable to trim. Patient states that the glucose reading this morning was 125. Saw PCP a week ago, last A1c was7. Patient  Reports that he was in hospital for PNA. Patient denies any other issues.    Patient Active Problem List   Diagnosis Date Noted  . S/P lumbar laminectomy 09/12/2014  . Prostate cancer ([place]) 03/10/2011  . Hypertension 03/10/2011  . High cholesterol 03/10/2011  . Kidney stones 03/10/2011  . Diabetic neuropathy ([place]) 03/10/2011  . Neuritis due to diabetes mellitus ([place]) 03/10/2011   Current Outpatient Medications on File Prior to Visit  Medication Sig Dispense Refill  . acetaminophen (TYLENOL) 500 MG tablet Take 1,000 mg by mouth at bedtime as needed (pain).    Marland Kitchen acetaminophen (TYLENOL) 500 MG tablet Take 1,000 mg by mouth.    Marland Kitchen amLODipine (NORVASC) 5 MG tablet Take 5 mg by mouth at bedtime.     Marland Kitchen atorvastatin (LIPITOR) 10 MG tablet Take 10 mg by mouth at bedtime.     Marland Kitchen azithromycin (ZITHROMAX) 500 MG tablet     . cefUROXime (CEFTIN) 500 MG tablet     . citalopram (CELEXA) 40 MG tablet Take 40 mg by mouth daily.     Marland Kitchen gabapentin (NEURONTIN) 300 MG capsule Take 600 mg by mouth 2 (two) times daily.     . hydrALAZINE (APRESOLINE) 25 MG tablet Take 50 mg by mouth 3 (three) times daily.     Marland Kitchen HYDROcodone-acetaminophen (NORCO) 10-325 MG per tablet Take 1 tablet by mouth every 4 (four) hours as needed for moderate pain. (Patient not taking: Reported on 12/15/2017) 45 tablet 0  . HYDROcodone-acetaminophen (NORCO/VICODIN) 5-325 MG per tablet     . insulin glargine (LANTUS) 100 UNIT/ML injection Inject 10 Units into the skin 2 (two) times daily.     . insulin lispro (HUMALOG) 100 UNIT/ML injection Inject 10-12 Units into the skin 3 (three) times daily before meals. Based on sliding scale     . levofloxacin  (LEVAQUIN) 750 MG tablet     . lisinopril (PRINIVIL,ZESTRIL) 40 MG tablet Take 40 mg by mouth at bedtime.     Marland Kitchen MAGNESIUM PO Take 1 tablet by mouth 2 (two) times daily.    . Misc Natural Products (OSTEO BI-FLEX TRIPLE STRENGTH) TABS Take 1 tablet by mouth 2 (two) times daily.    . naproxen sodium (ANAPROX) 220 MG tablet Take 440 mg by mouth at bedtime as needed (pain).    . Omega-3 Fatty Acids (FISH OIL PO) Take 1 capsule by mouth 2 (two) times daily.    . predniSONE (DELTASONE) 10 MG tablet     . temazepam (RESTORIL) 30 MG capsule Take 30 mg by mouth at bedtime as needed for sleep.     . valsartan-hydrochlorothiazide (DIOVAN-HCT) 320-25 MG per tablet Take 1 tablet by mouth daily.     . valsartan-hydrochlorothiazide (DIOVAN-HCT) 320-25 MG per tablet     . VENTOLIN HFA 108 (90 BASE) MCG/ACT inhaler      No current facility-administered medications on file prior to visit.    Allergies  Allergen Reactions  . Oxycodone-Acetaminophen Anxiety and Other (See Comments)    NERVOUS AND SHAKY     No results found for this or any previous visit (from the past 2160 hour(s)).  Objective: General: Patient is awake, alert, and oriented x 3 and in  no acute distress.  Integument: Skin is warm, dry and supple bilateral. Nails are tender, long, thickened and dystrophic with subungual debris, consistent with onychomycosis, 1-5 bilateral. No signs of infection. No open lesions or preulcerative lesions present bilateral. Remaining integument unremarkable.  Vasculature:  Dorsalis Pedis pulse 1/4 bilateral. Posterior Tibial pulse  0/4 bilateral. Capillary fill time <5 sec 1-5 bilateral. No hair growth to the level of the digits.Temperature gradient within normal limits. Mild varicosities present bilateral. 1+ pitting edema present bilateral.   Neurology: The patient has intact sensation measured with a 5.07/10g Semmes Weinstein Monofilament at all pedal sites bilateral. Vibratory sensation diminished bilateral  with tuning fork. No Babinski sign present bilateral.   Musculoskeletal: Asymptomatic hammertoe pedal deformities noted bilateral. Muscular strength 5/5 in all lower extremity muscular groups bilateral without pain on range of motion . No tenderness with calf compression bilateral.  Assessment and Plan: Problem List Items Addressed This Visit    None    Visit Diagnoses    Pain due to onychomycosis of toenail    -  Primary   Type 2 diabetes, controlled, with neuropathy ([place])         -Examined patient. -Discussed and educated patient on diabetic foot care, especially with  regards to the vascular, neurological and musculoskeletal systems.  -Stressed the importance of good glycemic control and the detriment of not  controlling glucose levels in relation to the foot. -Mechanically debrided all nails 1-5 bilateral using sterile nail nipper and filed with dremel without incident  -Recommend continue with elevation in legs to assist with edema control and Lasix as give by PCP -Patient to return  in 3 months for at risk foot care -Patient advised to call the office if any problems or questions arise in the meantime.  Landis Martins, DPM

## 2018-01-11 ENCOUNTER — Ambulatory Visit: Payer: Medicare Other | Admitting: Sports Medicine

## 2018-01-17 ENCOUNTER — Other Ambulatory Visit: Payer: Self-pay | Admitting: *Deleted

## 2018-01-17 NOTE — Patient Outreach (Signed)
[facility]) Care Management  01/17/2018  [name] 1935-03-21 [id number]   Late entry for contact on 6/11   Successful outreach to patient,. he reports that he is getting along better.  Patient discussed recent visit to urologist about UTI problems reports that symptoms have been improving, started on new medication Flomax.  Patient discussed his breathing and cough have improved since his recent episode of pneumonia, he continues to use nebulizer at least once daily . Patient states he continues to smoke, but less on desires to quit.   Patient reports he continues to use his cane when out of home due to neuropathy in feet, he denies recent fall.  Patient reports his blood sugar still remain good, in the 100-150 range. Patient denies increased episode or recent of low blood sugar. Patient reports he is drinking a boost daily reinforced this is no a meal replacement but supplement meals that he eats.   Patient report upcoming appointment with PCP in this month.  Patient denies any new concerns.   Plan  Will plan follow up call in the next month and if no new concerns will plan case closure.    Joylene Draft, RN, [place] Management Coordinator  [phone]- Mobile [phone]- Toll Free Main Office

## 2018-01-30 DIAGNOSIS — M25562 Pain in left knee: Secondary | ICD-10-CM | POA: Diagnosis not present

## 2018-01-30 DIAGNOSIS — M1712 Unilateral primary osteoarthritis, left knee: Secondary | ICD-10-CM | POA: Diagnosis not present

## 2018-02-01 DIAGNOSIS — E785 Hyperlipidemia, unspecified: Secondary | ICD-10-CM | POA: Diagnosis not present

## 2018-02-01 DIAGNOSIS — D638 Anemia in other chronic diseases classified elsewhere: Secondary | ICD-10-CM | POA: Diagnosis not present

## 2018-02-01 DIAGNOSIS — I10 Essential (primary) hypertension: Secondary | ICD-10-CM | POA: Diagnosis not present

## 2018-02-01 DIAGNOSIS — N183 Chronic kidney disease, stage 3 (moderate): Secondary | ICD-10-CM | POA: Diagnosis not present

## 2018-02-01 DIAGNOSIS — E1142 Type 2 diabetes mellitus with diabetic polyneuropathy: Secondary | ICD-10-CM | POA: Diagnosis not present

## 2018-02-05 DIAGNOSIS — H353131 Nonexudative age-related macular degeneration, bilateral, early dry stage: Secondary | ICD-10-CM | POA: Diagnosis not present

## 2018-02-06 DIAGNOSIS — M25562 Pain in left knee: Secondary | ICD-10-CM | POA: Diagnosis not present

## 2018-02-06 DIAGNOSIS — M1712 Unilateral primary osteoarthritis, left knee: Secondary | ICD-10-CM | POA: Diagnosis not present

## 2018-02-07 ENCOUNTER — Other Ambulatory Visit: Payer: Self-pay | Admitting: *Deleted

## 2018-02-07 NOTE — Patient Outreach (Signed)
[facility]) Care Management  02/07/2018  Gavinn Collard 03-02-1935 [id number]    Telephone follow up call .   Unsuccessful telephone outreach call, able to leave a HIPAA compliant message for return call.   Plan  Will await return call if no response will send unsuccessful outreach letter and plan return call in 4 business  days.    Joylene Draft, RN, [place] Management Coordinator  [phone]- Mobile [phone]- Toll Free Main Office

## 2018-02-12 ENCOUNTER — Other Ambulatory Visit: Payer: Self-pay | Admitting: *Deleted

## 2018-02-12 DIAGNOSIS — M25562 Pain in left knee: Secondary | ICD-10-CM | POA: Diagnosis not present

## 2018-02-12 DIAGNOSIS — M1712 Unilateral primary osteoarthritis, left knee: Secondary | ICD-10-CM | POA: Diagnosis not present

## 2018-02-12 NOTE — Patient Outreach (Signed)
[facility]) Care Management  02/12/2018  Atanacio Melnyk 31-May-1935 [id number]  Telephone follow up call  Telephone call attempt #2  Referral received 12/06/17 Referral reason : Recent discharge from [facility] , Dx: Pneumonia Referral source : Referral from patient spouse.  Insurance : Medicare  Unsuccessful telephone call attempt #2, able to leave a HIPAA compliant message for return call.   Plan  Will schedule call attempt #3 on day 7, 7/11 if no return call.    Joylene Draft, RN, [place] Management Coordinator  [phone]- Mobile [phone]- Toll Free Main Office

## 2018-02-15 ENCOUNTER — Other Ambulatory Visit: Payer: Self-pay | Admitting: *Deleted

## 2018-02-15 NOTE — Patient Outreach (Signed)
[facility]) Care Management  02/15/2018  [name] [birth year] [id number]   Case Closure    Successful outreach call to patient , HIPAA verified.  Patient reports  that he has been doing pretty good except problem with his knee.   He discussed that arthritis in right knee has been bothering him and he has been receiving injections in knee every week for 4 weeks states he has 2 more to go states it has been helping.  Patient further discussed :  Recent Pneumonia. Patient denies any increase in shortness of breath , sputum color change, cough.   Diabetes Patient continues to monitor daily, reports reading having been running in the 140's, usually runs in the 110's, he attributes this to injections he is receiving in his knee. Patient denies recent low blood sugar episode, he is able to state low blood sugar range 70 or symptoms, he reports how he would treat low readings .  Patient reports that he has a visit with PCP in the next month .  Recent UTI Reports he has follow up appointment with urology on tomorrow.   Patient denies any new concerns at this time, reviewed patient care goals as met. Patient declined need for further home visit or calls. Patient reports he is managing well at home, taking medications as prescribed, attending all medical appointment. Patient is agreeable to case closure and ensured patient has [facility] care management contact number for future needs.    Plan  Will close case, goals met will send PCP case closure note.   Joylene Draft, RN, [place] Management Coordinator  [phone]- Mobile [phone]- Toll Free Main Office

## 2018-02-16 DIAGNOSIS — R300 Dysuria: Secondary | ICD-10-CM | POA: Diagnosis not present

## 2018-02-19 DIAGNOSIS — M1712 Unilateral primary osteoarthritis, left knee: Secondary | ICD-10-CM | POA: Diagnosis not present

## 2018-02-19 DIAGNOSIS — M25562 Pain in left knee: Secondary | ICD-10-CM | POA: Diagnosis not present

## 2018-03-14 DIAGNOSIS — N390 Urinary tract infection, site not specified: Secondary | ICD-10-CM | POA: Diagnosis not present

## 2018-03-14 DIAGNOSIS — N184 Chronic kidney disease, stage 4 (severe): Secondary | ICD-10-CM | POA: Diagnosis not present

## 2018-03-14 DIAGNOSIS — R809 Proteinuria, unspecified: Secondary | ICD-10-CM | POA: Diagnosis not present

## 2018-03-14 DIAGNOSIS — I10 Essential (primary) hypertension: Secondary | ICD-10-CM | POA: Diagnosis not present

## 2018-03-27 DIAGNOSIS — R0600 Dyspnea, unspecified: Secondary | ICD-10-CM | POA: Diagnosis not present

## 2018-03-27 DIAGNOSIS — R600 Localized edema: Secondary | ICD-10-CM | POA: Diagnosis not present

## 2018-03-28 DIAGNOSIS — J90 Pleural effusion, not elsewhere classified: Secondary | ICD-10-CM | POA: Diagnosis not present

## 2018-03-28 DIAGNOSIS — R0600 Dyspnea, unspecified: Secondary | ICD-10-CM | POA: Diagnosis not present

## 2018-04-04 DIAGNOSIS — N183 Chronic kidney disease, stage 3 (moderate): Secondary | ICD-10-CM | POA: Diagnosis not present

## 2018-04-06 DIAGNOSIS — N390 Urinary tract infection, site not specified: Secondary | ICD-10-CM | POA: Diagnosis not present

## 2018-04-06 DIAGNOSIS — N184 Chronic kidney disease, stage 4 (severe): Secondary | ICD-10-CM | POA: Diagnosis not present

## 2018-04-06 DIAGNOSIS — I10 Essential (primary) hypertension: Secondary | ICD-10-CM | POA: Diagnosis not present

## 2018-04-11 ENCOUNTER — Encounter: Payer: Self-pay | Admitting: Sports Medicine

## 2018-04-11 ENCOUNTER — Ambulatory Visit (INDEPENDENT_AMBULATORY_CARE_PROVIDER_SITE_OTHER): Payer: Medicare Other | Admitting: Sports Medicine

## 2018-04-11 VITALS — BP 179/76 | HR 50 | Resp 15

## 2018-04-11 DIAGNOSIS — M79676 Pain in unspecified toe(s): Secondary | ICD-10-CM

## 2018-04-11 DIAGNOSIS — E1140 Type 2 diabetes mellitus with diabetic neuropathy, unspecified: Secondary | ICD-10-CM

## 2018-04-11 DIAGNOSIS — B351 Tinea unguium: Secondary | ICD-10-CM

## 2018-04-11 NOTE — Progress Notes (Signed)
Subjective: [name] is a 83 y.o. male patient with history of diabetes who presents to office today complaining of long, painful nails  while ambulating in shoes; unable to trim. Patient states that the glucose reading this morning was 150. Saw PCP, Dr. Delena Bali 2 months ago, last A1c was 5.9.  Patient reports that he has to see a pulmonologist and has to see his nephrologist coming up.  Reports that there was some adjustments made to his medication to help to protect his kidneys but did not bring a list today.  Patient denies any other issues.    Patient Active Problem List   Diagnosis Date Noted  . S/P lumbar laminectomy 09/12/2014  . Prostate cancer ([place]) 03/10/2011  . Hypertension 03/10/2011  . High cholesterol 03/10/2011  . Kidney stones 03/10/2011  . Diabetic neuropathy ([place]) 03/10/2011  . Neuritis due to diabetes mellitus ([place]) 03/10/2011   Current Outpatient Medications on File Prior to Visit  Medication Sig Dispense Refill  . acetaminophen (TYLENOL) 500 MG tablet Take 1,000 mg by mouth at bedtime as needed (pain).    Marland Kitchen acetaminophen (TYLENOL) 500 MG tablet Take 1,000 mg by mouth.    Marland Kitchen amLODipine (NORVASC) 5 MG tablet Take 5 mg by mouth at bedtime.     Marland Kitchen atorvastatin (LIPITOR) 10 MG tablet Take 10 mg by mouth at bedtime.     Marland Kitchen azithromycin (ZITHROMAX) 500 MG tablet     . cefUROXime (CEFTIN) 500 MG tablet     . citalopram (CELEXA) 40 MG tablet Take 40 mg by mouth daily.     Marland Kitchen gabapentin (NEURONTIN) 300 MG capsule Take 600 mg by mouth 2 (two) times daily.     . hydrALAZINE (APRESOLINE) 25 MG tablet Take 50 mg by mouth 3 (three) times daily.     Marland Kitchen HYDROcodone-acetaminophen (NORCO) 10-325 MG per tablet Take 1 tablet by mouth every 4 (four) hours as needed for moderate pain. 45 tablet 0  . HYDROcodone-acetaminophen (NORCO/VICODIN) 5-325 MG per tablet     . insulin glargine (LANTUS) 100 UNIT/ML injection Inject 10 Units into the skin 2 (two) times daily.     . insulin lispro  (HUMALOG) 100 UNIT/ML injection Inject 10-12 Units into the skin 3 (three) times daily before meals. Based on sliding scale     . levofloxacin (LEVAQUIN) 750 MG tablet     . lisinopril (PRINIVIL,ZESTRIL) 40 MG tablet Take 40 mg by mouth at bedtime.     Marland Kitchen MAGNESIUM PO Take 1 tablet by mouth 2 (two) times daily.    . Misc Natural Products (OSTEO BI-FLEX TRIPLE STRENGTH) TABS Take 1 tablet by mouth 2 (two) times daily.    . naproxen sodium (ANAPROX) 220 MG tablet Take 440 mg by mouth at bedtime as needed (pain).    . Omega-3 Fatty Acids (FISH OIL PO) Take 1 capsule by mouth 2 (two) times daily.    . predniSONE (DELTASONE) 10 MG tablet     . temazepam (RESTORIL) 30 MG capsule Take 30 mg by mouth at bedtime as needed for sleep.     . valsartan-hydrochlorothiazide (DIOVAN-HCT) 320-25 MG per tablet Take 1 tablet by mouth daily.     . valsartan-hydrochlorothiazide (DIOVAN-HCT) 320-25 MG per tablet     . VENTOLIN HFA 108 (90 BASE) MCG/ACT inhaler      No current facility-administered medications on file prior to visit.    Allergies  Allergen Reactions  . Oxycodone-Acetaminophen Anxiety and Other (See Comments)    NERVOUS AND SHAKY  No results found for this or any previous visit (from the past 2160 hour(s)).  Objective: General: Patient is awake, alert, and oriented x 3 and in no acute distress.  Integument: Skin is warm, dry and supple bilateral. Nails are tender, long, thickened and dystrophic with subungual debris, consistent with onychomycosis, 1-5 bilateral. No signs of infection. No open lesions or preulcerative lesions present bilateral. Remaining integument unremarkable.  Vasculature:  Dorsalis Pedis pulse 1/4 bilateral. Posterior Tibial pulse  0/4 bilateral. Capillary fill time <5 sec 1-5 bilateral. No hair growth to the level of the digits.Temperature gradient within normal limits. Mild varicosities present bilateral. 1+ pitting edema present bilateral.   Neurology: The patient has  intact sensation measured with a 5.07/10g Semmes Weinstein Monofilament at all pedal sites bilateral. Vibratory sensation diminished bilateral with tuning fork. No Babinski sign present bilateral.   Musculoskeletal: Asymptomatic hammertoe pedal deformities noted bilateral. Muscular strength 5/5 in all lower extremity muscular groups bilateral without pain on range of motion . No tenderness with calf compression bilateral.  Assessment and Plan: Problem List Items Addressed This Visit    None    Visit Diagnoses    Pain due to onychomycosis of toenail    -  Primary   Type 2 diabetes, controlled, with neuropathy ([place])         -Examined patient. -Discussed and educated patient on diabetic foot care, especially with  regards to the vascular, neurological and musculoskeletal systems.  -Mechanically debrided all nails 1-5 bilateral using sterile nail nipper and filed with dremel without incident  -Recommend continue with elevation in legs to assist with edema control as previously recommended -Patient to return in 3 months for at risk foot care -Patient advised to call the office if any problems or questions arise in the meantime.  Landis Martins, DPM

## 2018-04-13 DIAGNOSIS — C61 Malignant neoplasm of prostate: Secondary | ICD-10-CM | POA: Diagnosis not present

## 2018-04-13 DIAGNOSIS — R300 Dysuria: Secondary | ICD-10-CM | POA: Diagnosis not present

## 2018-04-13 DIAGNOSIS — Z192 Hormone resistant malignancy status: Secondary | ICD-10-CM | POA: Diagnosis not present

## 2018-04-17 DIAGNOSIS — R0600 Dyspnea, unspecified: Secondary | ICD-10-CM | POA: Diagnosis not present

## 2018-04-25 ENCOUNTER — Encounter: Payer: Self-pay | Admitting: Internal Medicine

## 2018-04-25 DIAGNOSIS — I2510 Atherosclerotic heart disease of native coronary artery without angina pectoris: Secondary | ICD-10-CM | POA: Diagnosis not present

## 2018-04-25 DIAGNOSIS — I509 Heart failure, unspecified: Secondary | ICD-10-CM | POA: Diagnosis not present

## 2018-04-25 DIAGNOSIS — J918 Pleural effusion in other conditions classified elsewhere: Secondary | ICD-10-CM | POA: Diagnosis not present

## 2018-04-25 DIAGNOSIS — K7460 Unspecified cirrhosis of liver: Secondary | ICD-10-CM | POA: Diagnosis not present

## 2018-04-25 DIAGNOSIS — I270 Primary pulmonary hypertension: Secondary | ICD-10-CM | POA: Diagnosis not present

## 2018-04-25 DIAGNOSIS — I700 Atherosclerosis of aorta: Secondary | ICD-10-CM | POA: Diagnosis not present

## 2018-04-25 DIAGNOSIS — J90 Pleural effusion, not elsewhere classified: Secondary | ICD-10-CM | POA: Diagnosis not present

## 2018-04-30 DIAGNOSIS — N189 Chronic kidney disease, unspecified: Secondary | ICD-10-CM | POA: Diagnosis not present

## 2018-04-30 DIAGNOSIS — D631 Anemia in chronic kidney disease: Secondary | ICD-10-CM | POA: Diagnosis not present

## 2018-04-30 DIAGNOSIS — D509 Iron deficiency anemia, unspecified: Secondary | ICD-10-CM | POA: Diagnosis not present

## 2018-04-30 DIAGNOSIS — Z862 Personal history of diseases of the blood and blood-forming organs and certain disorders involving the immune mechanism: Secondary | ICD-10-CM | POA: Diagnosis not present

## 2018-05-01 DIAGNOSIS — I270 Primary pulmonary hypertension: Secondary | ICD-10-CM | POA: Diagnosis not present

## 2018-05-01 DIAGNOSIS — R918 Other nonspecific abnormal finding of lung field: Secondary | ICD-10-CM | POA: Diagnosis not present

## 2018-05-01 LAB — PULMONARY FUNCTION TEST

## 2018-05-04 DIAGNOSIS — I10 Essential (primary) hypertension: Secondary | ICD-10-CM | POA: Diagnosis not present

## 2018-05-04 DIAGNOSIS — N183 Chronic kidney disease, stage 3 (moderate): Secondary | ICD-10-CM | POA: Diagnosis not present

## 2018-05-09 DIAGNOSIS — E785 Hyperlipidemia, unspecified: Secondary | ICD-10-CM | POA: Diagnosis not present

## 2018-05-09 DIAGNOSIS — Z23 Encounter for immunization: Secondary | ICD-10-CM | POA: Diagnosis not present

## 2018-05-09 DIAGNOSIS — D638 Anemia in other chronic diseases classified elsewhere: Secondary | ICD-10-CM | POA: Diagnosis not present

## 2018-05-09 DIAGNOSIS — I10 Essential (primary) hypertension: Secondary | ICD-10-CM | POA: Diagnosis not present

## 2018-05-09 DIAGNOSIS — C61 Malignant neoplasm of prostate: Secondary | ICD-10-CM | POA: Diagnosis not present

## 2018-05-09 DIAGNOSIS — N183 Chronic kidney disease, stage 3 (moderate): Secondary | ICD-10-CM | POA: Diagnosis not present

## 2018-05-09 DIAGNOSIS — E1142 Type 2 diabetes mellitus with diabetic polyneuropathy: Secondary | ICD-10-CM | POA: Diagnosis not present

## 2018-05-11 DIAGNOSIS — N184 Chronic kidney disease, stage 4 (severe): Secondary | ICD-10-CM | POA: Diagnosis not present

## 2018-05-11 DIAGNOSIS — I10 Essential (primary) hypertension: Secondary | ICD-10-CM | POA: Diagnosis not present

## 2018-05-17 ENCOUNTER — Telehealth (HOSPITAL_COMMUNITY): Payer: Self-pay

## 2018-05-17 ENCOUNTER — Encounter (HOSPITAL_COMMUNITY): Payer: Self-pay | Admitting: *Deleted

## 2018-05-17 ENCOUNTER — Other Ambulatory Visit (HOSPITAL_COMMUNITY): Payer: Self-pay | Admitting: *Deleted

## 2018-05-17 DIAGNOSIS — I272 Pulmonary hypertension, unspecified: Secondary | ICD-10-CM

## 2018-05-17 DIAGNOSIS — R0600 Dyspnea, unspecified: Secondary | ICD-10-CM

## 2018-05-17 NOTE — Telephone Encounter (Signed)
Pt called and scheduled heart Cath and scheduled labs

## 2018-05-17 NOTE — Telephone Encounter (Signed)
Pt was referred for RHC per Dr Alcide Clever for dyspnea, r/o pulm htn.

## 2018-05-22 ENCOUNTER — Ambulatory Visit (HOSPITAL_COMMUNITY)
Admission: RE | Admit: 2018-05-22 | Discharge: 2018-05-22 | Disposition: A | Discharge status: Home / Self Care | Payer: Medicare Other | Source: Ambulatory Visit | Attending: Internal Medicine | Admitting: Internal Medicine

## 2018-05-22 DIAGNOSIS — I272 Pulmonary hypertension, unspecified: Secondary | ICD-10-CM | POA: Diagnosis not present

## 2018-05-22 LAB — BASIC METABOLIC PANEL
Anion gap: 7 (ref 5–15)
BUN: 36 mg/dL — ABNORMAL HIGH (ref 8–23)
CO2: 20 mmol/L — ABNORMAL LOW (ref 22–32)
Calcium: 8.7 mg/dL — ABNORMAL LOW (ref 8.9–10.3)
Chloride: 110 mmol/L (ref 98–111)
Creatinine, Ser: 2.08 mg/dL — ABNORMAL HIGH (ref 0.61–1.24)
GFR calc Af Amer: 33 mL/min — ABNORMAL LOW (ref >60)
GFR calc non Af Amer: 28 mL/min — ABNORMAL LOW (ref >60)
Glucose, Bld: 51 mg/dL — ABNORMAL LOW (ref 70–99)
Potassium: 3.8 mmol/L (ref 3.5–5.1)
Sodium: 137 mmol/L (ref 135–145)

## 2018-05-22 LAB — CBC
HCT: 34.1 % — ABNORMAL LOW (ref 39.0–52.0)
Hemoglobin: 11 g/dL — ABNORMAL LOW (ref 13.0–17.0)
MCH: 30 pg (ref 26.0–34.0)
MCHC: 32.3 g/dL (ref 30.0–36.0)
MCV: 92.9 fL (ref 80.0–100.0)
Platelets: 149 10*3/uL — ABNORMAL LOW (ref 150–400)
RBC: 3.67 MIL/uL — ABNORMAL LOW (ref 4.22–5.81)
RDW: 12.7 % (ref 11.5–15.5)
WBC: 8.8 10*3/uL (ref 4.0–10.5)
nRBC: 0 % (ref 0.0–0.2)

## 2018-05-22 LAB — PROTIME-INR
INR: 1.17
Prothrombin Time: 14.8 seconds (ref 11.4–15.2)

## 2018-05-23 DIAGNOSIS — M25562 Pain in left knee: Secondary | ICD-10-CM | POA: Diagnosis not present

## 2018-05-23 DIAGNOSIS — M1712 Unilateral primary osteoarthritis, left knee: Secondary | ICD-10-CM | POA: Diagnosis not present

## 2018-06-01 ENCOUNTER — Ambulatory Visit (HOSPITAL_COMMUNITY): Admission: RE | Admit: 2018-06-01 | Payer: Medicare Other | Source: Ambulatory Visit | Admitting: Internal Medicine

## 2018-06-01 ENCOUNTER — Encounter (HOSPITAL_COMMUNITY): Admission: RE | Payer: Self-pay | Source: Ambulatory Visit

## 2018-06-01 DIAGNOSIS — R339 Retention of urine, unspecified: Secondary | ICD-10-CM | POA: Diagnosis not present

## 2018-06-01 DIAGNOSIS — R300 Dysuria: Secondary | ICD-10-CM | POA: Diagnosis not present

## 2018-06-01 DIAGNOSIS — R3915 Urgency of urination: Secondary | ICD-10-CM | POA: Diagnosis not present

## 2018-06-01 DIAGNOSIS — N3001 Acute cystitis with hematuria: Secondary | ICD-10-CM | POA: Diagnosis not present

## 2018-06-01 SURGERY — RIGHT HEART CATH
Anesthesia: LOCAL

## 2018-06-04 ENCOUNTER — Encounter (HOSPITAL_COMMUNITY): Payer: Self-pay | Admitting: *Deleted

## 2018-06-05 DIAGNOSIS — R339 Retention of urine, unspecified: Secondary | ICD-10-CM | POA: Diagnosis not present

## 2018-06-05 DIAGNOSIS — C61 Malignant neoplasm of prostate: Secondary | ICD-10-CM | POA: Diagnosis not present

## 2018-06-05 DIAGNOSIS — N329 Bladder disorder, unspecified: Secondary | ICD-10-CM | POA: Diagnosis not present

## 2018-06-05 DIAGNOSIS — N138 Other obstructive and reflux uropathy: Secondary | ICD-10-CM | POA: Diagnosis not present

## 2018-06-09 ENCOUNTER — Telehealth: Payer: Self-pay | Admitting: Physician Assistant

## 2018-06-09 NOTE — Telephone Encounter (Signed)
    Wife called our on-call provider because [name] had a mechanical fall while in the kitchen.  He was able to grab onto a barstool and only suffered minor scrapes.  She denies loss of consciousness, chest pain or worsening shortness of breath. His pulmonologist had arranged for a heart cath with Dr. Haroldine Laws on Monday. They wanted to make sure he was still okay to have cath   I reassured her that he would be okay to proceed with his heart cath as planned on Monday.   Angelena Form PA-C  [facility]

## 2018-06-10 ENCOUNTER — Telehealth: Payer: Self-pay | Admitting: Physician Assistant

## 2018-06-10 NOTE — Telephone Encounter (Addendum)
The patients wife called the answering service after-hours today. Clarifying where to present for cath tomorrow. Advised she check in at [facility] admitting and they will direct her. She verbalized understanding and gratitude. Tyquon Near PA-C

## 2018-06-11 ENCOUNTER — Ambulatory Visit (HOSPITAL_COMMUNITY)
Admission: RE | Admit: 2018-06-11 | Discharge: 2018-06-11 | Disposition: A | Discharge status: Home / Self Care | Payer: Medicare Other | Source: Ambulatory Visit | Attending: Internal Medicine | Admitting: Internal Medicine

## 2018-06-11 ENCOUNTER — Encounter (HOSPITAL_COMMUNITY): Payer: Self-pay | Admitting: Internal Medicine

## 2018-06-11 ENCOUNTER — Encounter (HOSPITAL_COMMUNITY)
Admission: RE | Disposition: A | Discharge status: Home / Self Care | Payer: Self-pay | Source: Ambulatory Visit | Attending: Internal Medicine

## 2018-06-11 DIAGNOSIS — R0609 Other forms of dyspnea: Secondary | ICD-10-CM | POA: Diagnosis not present

## 2018-06-11 DIAGNOSIS — Z885 Allergy status to narcotic agent status: Secondary | ICD-10-CM | POA: Insufficient documentation

## 2018-06-11 DIAGNOSIS — Z8546 Personal history of malignant neoplasm of prostate: Secondary | ICD-10-CM | POA: Diagnosis not present

## 2018-06-11 DIAGNOSIS — E669 Obesity, unspecified: Secondary | ICD-10-CM | POA: Insufficient documentation

## 2018-06-11 DIAGNOSIS — E1122 Type 2 diabetes mellitus with diabetic chronic kidney disease: Secondary | ICD-10-CM | POA: Insufficient documentation

## 2018-06-11 DIAGNOSIS — N183 Chronic kidney disease, stage 3 (moderate): Secondary | ICD-10-CM | POA: Insufficient documentation

## 2018-06-11 DIAGNOSIS — K219 Gastro-esophageal reflux disease without esophagitis: Secondary | ICD-10-CM | POA: Diagnosis not present

## 2018-06-11 DIAGNOSIS — J449 Chronic obstructive pulmonary disease, unspecified: Secondary | ICD-10-CM | POA: Insufficient documentation

## 2018-06-11 DIAGNOSIS — F17210 Nicotine dependence, cigarettes, uncomplicated: Secondary | ICD-10-CM | POA: Insufficient documentation

## 2018-06-11 DIAGNOSIS — Z87442 Personal history of urinary calculi: Secondary | ICD-10-CM | POA: Insufficient documentation

## 2018-06-11 DIAGNOSIS — I130 Hypertensive heart and chronic kidney disease with heart failure and stage 1 through stage 4 chronic kidney disease, or unspecified chronic kidney disease: Secondary | ICD-10-CM | POA: Insufficient documentation

## 2018-06-11 DIAGNOSIS — E7800 Pure hypercholesterolemia, unspecified: Secondary | ICD-10-CM | POA: Insufficient documentation

## 2018-06-11 HISTORY — PX: RIGHT HEART CATH: CATH118263

## 2018-06-11 LAB — BASIC METABOLIC PANEL
Anion gap: 4 — ABNORMAL LOW (ref 5–15)
BUN: 31 mg/dL — ABNORMAL HIGH (ref 8–23)
CO2: 18 mmol/L — ABNORMAL LOW (ref 22–32)
Calcium: 8.4 mg/dL — ABNORMAL LOW (ref 8.9–10.3)
Chloride: 115 mmol/L — ABNORMAL HIGH (ref 98–111)
Creatinine, Ser: 2.4 mg/dL — ABNORMAL HIGH (ref 0.61–1.24)
GFR calc Af Amer: 28 mL/min — ABNORMAL LOW (ref >60)
GFR calc non Af Amer: 24 mL/min — ABNORMAL LOW (ref >60)
Glucose, Bld: 104 mg/dL — ABNORMAL HIGH (ref 70–99)
Potassium: 4.5 mmol/L (ref 3.5–5.1)
Sodium: 137 mmol/L (ref 135–145)

## 2018-06-11 LAB — POCT I-STAT 3, VENOUS BLOOD GAS (G3P V)
Acid-base deficit: 9 mmol/L — ABNORMAL HIGH (ref 0.0–2.0)
Acid-base deficit: 9 mmol/L — ABNORMAL HIGH (ref 0.0–2.0)
Bicarbonate: 17 mmol/L — ABNORMAL LOW (ref 20.0–28.0)
Bicarbonate: 17.1 mmol/L — ABNORMAL LOW (ref 20.0–28.0)
O2 Saturation: 61 %
O2 Saturation: 62 %
TCO2: 18 mmol/L — ABNORMAL LOW (ref 22–32)
TCO2: 18 mmol/L — ABNORMAL LOW (ref 22–32)
pCO2, Ven: 35.9 mmHg — ABNORMAL LOW (ref 44.0–60.0)
pCO2, Ven: 36.1 mmHg — ABNORMAL LOW (ref 44.0–60.0)
pH, Ven: 7.282 (ref 7.250–7.430)
pH, Ven: 7.287 (ref 7.250–7.430)
pO2, Ven: 35 mmHg (ref 32.0–45.0)
pO2, Ven: 35 mmHg (ref 32.0–45.0)

## 2018-06-11 LAB — GLUCOSE, CAPILLARY
Glucose-Capillary: 92 mg/dL (ref 70–99)
Glucose-Capillary: 116 mg/dL — ABNORMAL HIGH (ref 70–99)

## 2018-06-11 SURGERY — RIGHT HEART CATH
Anesthesia: LOCAL

## 2018-06-11 MED ORDER — SODIUM CHLORIDE 0.9% FLUSH: 3.0000 mL | Freq: Two times a day (BID) | INTRAVENOUS | Status: DC

## 2018-06-11 MED ORDER — HEPARIN (PORCINE) IN NACL 1000-0.9 UT/500ML-% IV SOLN
INTRAVENOUS | Status: DC | PRN
  Administered 2018-06-11: 500 mL

## 2018-06-11 MED ORDER — LIDOCAINE HCL (PF) 1 % IJ SOLN
INTRAMUSCULAR | Status: AC
  Filled 2018-06-11: qty 30

## 2018-06-11 MED ORDER — LIDOCAINE HCL (PF) 1 % IJ SOLN
INTRAMUSCULAR | Status: DC | PRN
  Administered 2018-06-11: 1 mL

## 2018-06-11 MED ORDER — FENTANYL CITRATE (PF) 100 MCG/2ML IJ SOLN
INTRAMUSCULAR | Status: DC | PRN
  Administered 2018-06-11: 25 ug via INTRAVENOUS

## 2018-06-11 MED ORDER — SODIUM CHLORIDE 0.9 % IV SOLN
INTRAVENOUS | Status: DC
  Administered 2018-06-11: 08:00:00 via INTRAVENOUS

## 2018-06-11 MED ORDER — ASPIRIN 81 MG PO CHEW
81.0000 mg | CHEWABLE_TABLET | ORAL | Status: AC
  Administered 2018-06-11: 81 mg via ORAL
  Filled 2018-06-11: qty 1

## 2018-06-11 MED ORDER — SODIUM CHLORIDE 0.9% FLUSH: 3.0000 mL | INTRAVENOUS | Status: DC | PRN

## 2018-06-11 MED ORDER — FENTANYL CITRATE (PF) 100 MCG/2ML IJ SOLN
INTRAMUSCULAR | Status: AC
  Filled 2018-06-11: qty 2

## 2018-06-11 MED ORDER — SODIUM CHLORIDE 0.9 % IV SOLN: 250.0000 mL | INTRAVENOUS | Status: DC | PRN

## 2018-06-11 MED ORDER — MIDAZOLAM HCL 2 MG/2ML IJ SOLN
INTRAMUSCULAR | Status: DC | PRN
  Administered 2018-06-11: 1 mg via INTRAVENOUS

## 2018-06-11 MED ORDER — MIDAZOLAM HCL 2 MG/2ML IJ SOLN
INTRAMUSCULAR | Status: AC
  Filled 2018-06-11: qty 2

## 2018-06-11 MED ORDER — HEPARIN (PORCINE) IN NACL 1000-0.9 UT/500ML-% IV SOLN
INTRAVENOUS | Status: AC
  Filled 2018-06-11: qty 1000

## 2018-06-11 MED ORDER — ONDANSETRON HCL 4 MG/2ML IJ SOLN: 4.0000 mg | Freq: Four times a day (QID) | INTRAMUSCULAR | Status: DC | PRN

## 2018-06-11 MED ORDER — ACETAMINOPHEN 325 MG PO TABS: 650.0000 mg | ORAL_TABLET | ORAL | Status: DC | PRN

## 2018-06-11 SURGICAL SUPPLY — 8 items
CATH BALLN WEDGE 5F 110CM (CATHETERS) ×2 IMPLANT
PACK CARDIAC CATHETERIZATION (CUSTOM PROCEDURE TRAY) ×2 IMPLANT
PROTECTION STATION PRESSURIZED (MISCELLANEOUS) ×2
SHEATH GLIDE SLENDER 4/5FR (SHEATH) ×2 IMPLANT
STATION PROTECTION PRESSURIZED (MISCELLANEOUS) ×1 IMPLANT
TRANSDUCER W/STOPCOCK (MISCELLANEOUS) ×2 IMPLANT
TUBING ART PRESS 72  MALE/FEM (TUBING) ×1
TUBING ART PRESS 72 MALE/FEM (TUBING) ×1 IMPLANT

## 2018-06-11 NOTE — Interval H&P Note (Signed)
History and Physical Interval Note:  06/11/2018 8:16 AM  [name]  has presented today for surgery, with the diagnosis of hp  The various methods of treatment have been discussed with the patient and family. After consideration of risks, benefits and other options for treatment, the patient has consented to  Procedure(s): RIGHT HEART CATH (N/A) and left heart cath with coronary angiography and possible angioplasty aas a surgical intervention .  The patient's history has been reviewed, patient examined, no change in status, stable for surgery.  I have reviewed the patient's chart and labs.  Questions were answered to the patient's satisfaction.     Daniel Bensimhon

## 2018-06-11 NOTE — H&P (Signed)
ADVANCED HF TEAM CONSULT NOTE  Referring Physician: Dr, Alcide Clever   HPI:  Mr, [name] is an 83 y/o male with COPD, recent PNA, DM2, obesity, CKD III and prostate cancer referred by Dr. Alcide Clever for heart catheterization due to persistent dyspnea and recent HF.  Denies any h/o CAD or other heart disease. Has been a smoker for a long time. ~1ppd. Recent admitted for PNA twice. After most recent admission this summer remained quite SOB and said he developed fluid in his lungs. Recently started on lasix and has improved though still gets SOB at times. Denies chest pain or pressure. Has had DM2 for > 15 years. Unable to tell me if he has had an echo or not. Sees a nephrologist and says his kidneys are at "40%.   Review of Systems: [y] = yes, [ ]  = no   General: Weight gain [ ] ; Weight loss [ y]; Anorexia [ ] ; Fatigue Blue.Reese ]; Fever [ ] ; Chills [ ] ; Weakness [ ]   Cardiac: Chest pain/pressure [ ] ; Resting SOB [ ] ; Exertional SOB Blue.Reese ]; Orthopnea [ ] ; Pedal Edema [ y]; Palpitations [ ] ; Syncope [ ] ; Presyncope [ ] ; Paroxysmal nocturnal dyspnea[ ]   Pulmonary: Cough [ ] ; Wheezing[ ] ; Hemoptysis[ ] ; Sputum [ ] ; Snoring [ ]   GI: Vomiting[ ] ; Dysphagia[ ] ; Melena[ ] ; Hematochezia [ ] ; Heartburn[ ] ; Abdominal pain [ ] ; Constipation [ ] ; Diarrhea [ ] ; BRBPR [ ]   GU: Hematuria[ ] ; Dysuria [ ] ; Nocturia[ ]   Vascular: Pain in legs with walking [ ] ; Pain in feet with lying flat [ ] ; Non-healing sores [ ] ; Stroke [ ] ; TIA [ ] ; Slurred speech [ ] ;  Neuro: Headaches[ ] ; Vertigo[ ] ; Seizures[ ] ; Paresthesias[ ] ;Blurred vision [ ] ; Diplopia [ ] ; Vision changes [ ]   Ortho/Skin: Arthritis Blue.Reese ]; Joint pain Blue.Reese ]; Muscle pain [ ] ; Joint swelling [ ] ; Back Pain [ ] ; Rash [ ]   Psych: Depression[ ] ; Anxiety[ ]   Heme: Bleeding problems [ ] ; Clotting disorders [ ] ; Anemia [ ]   Endocrine: Diabetes Blue.Reese ]; Thyroid dysfunction[ ]    Past Medical History:  Diagnosis Date  . Anxiety   . Bronchitis   . Depression   . Diabetes  mellitus without complication ([place])    on meds  . GERD (gastroesophageal reflux disease)   . High cholesterol   . Hypertension   . Kidney stones   . Prostate cancer ([place])     Current Facility-Administered Medications  Medication Dose Route Frequency Provider Last Rate Last Dose  . 0.9 %  sodium chloride infusion  250 mL Intravenous PRN Bensimhon, Shaune Pascal, MD      . 0.9 %  sodium chloride infusion   Intravenous Continuous Bensimhon, Shaune Pascal, MD      . aspirin chewable tablet 81 mg  81 mg Oral Pre-Cath Bensimhon, Shaune Pascal, MD      . sodium chloride flush (NS) 0.9 % injection 3 mL  3 mL Intravenous Q12H Bensimhon, Shaune Pascal, MD      . sodium chloride flush (NS) 0.9 % injection 3 mL  3 mL Intravenous PRN Bensimhon, Shaune Pascal, MD        Allergies  Allergen Reactions  . Oxycodone-Acetaminophen Anxiety and Other (See Comments)    NERVOUS AND SHAKY       Social History   Socioeconomic History  . Marital status: Married    Spouse name: Not on file  . Number of children: Not on file  . Years  of education: Not on file  . Highest education level: Not on file  Occupational History  . Not on file  Social Needs  . Financial resource strain: Not on file  . Food insecurity:    Worry: Not on file    Inability: Not on file  . Transportation needs:    Medical: Not on file    Non-medical: Not on file  Tobacco Use  . Smoking status: Current Every Day Smoker    Packs/day: 1.50    Types: Cigarettes  . Smokeless tobacco: Never Used  Substance and Sexual Activity  . Alcohol use: No  . Drug use: No  . Sexual activity: Not on file  Lifestyle  . Physical activity:    Days per week: Not on file    Minutes per session: Not on file  . Stress: Not on file  Relationships  . Social connections:    Talks on phone: Not on file    Gets together: Not on file    Attends religious service: Not on file    Active member of club or organization: Not on file    Attends meetings of clubs or  organizations: Not on file    Relationship status: Not on file  . Intimate partner violence:    Fear of current or ex partner: Not on file    Emotionally abused: Not on file    Physically abused: Not on file    Forced sexual activity: Not on file  Other Topics Concern  . Not on file  Social History Narrative  . Not on file     FHx:  No FHx of premature CAD or sudden cardiac death. No family h/o of HF  Vitals:   06/11/18 0712  BP: (!) 166/65  Pulse: (!) 52  Resp: 18  Temp: 98.6 F (37 C)  TempSrc: Oral  SpO2: 98%  Weight: 105.7 kg  Height: (!) 6" (0.152 m)    PHYSICAL EXAM: General:  Elderly male sitting up in bed. No respiratory difficulty HEENT: normal Neck: supple. JVP 7-8. Carotids 2+ bilat; no bruits. No lymphadenopathy or thryomegaly appreciated. Cor: PMI nondisplaced. Regular rate & rhythm. No rubs, gallops or murmurs. Lungs: clear with decreased breath sounds throughout Abdomen: obese soft, nontender, nondistended. No hepatosplenomegaly. No bruits or masses. Good bowel sounds. Extremities: no cyanosis, clubbing, rash, 1+ edema Neuro: alert & oriented x 3, cranial nerves grossly intact. moves all 4 extremities w/o difficulty. Affect pleasant.  ECG: pending  ASSESSMENT & PLAN:  1. Dyspnea on exertion - multiple possible etiologies but by history and on exam appears to have a component of HF. Agree with proceeding with RHC to sort this out. Given multiple CRFs will also perform coronary angiography if creatinine or less. If he not already had one, would consider sleep study as well.   2. CKD III - check labs today  Glori Bickers, MD  8:15 AM

## 2018-06-11 NOTE — Discharge Instructions (Signed)
Venogram, Care After °This sheet gives you information about how to care for yourself after your procedure. Your health care provider may also give you more specific instructions. If you have problems or questions, contact your health care provider. °What can I expect after the procedure? °After the procedure, it is common to have: °· Bruising or mild discomfort in the area where the IV was inserted (insertion site). ° °Follow these instructions at home: °Eating and drinking °· Follow instructions from your health care provider about eating or drinking restrictions. °· Drink a lot of fluids for the first several days after the procedure, as directed by your health care provider. This helps to wash (flush) the contrast out of your body. Examples of healthy fluids include water or low-calorie drinks. °General instructions °· Check your IV insertion area every day for signs of infection. Check for: °? Redness, swelling, or pain. °? Fluid or blood. °? Warmth. °? Pus or a bad smell. °· Take over-the-counter and prescription medicines only as told by your health care provider. °· Rest and return to your normal activities as told by your health care provider. Ask your health care provider what activities are safe for you. °· Do not drive for 24 hours if you were given a medicine to help you relax (sedative), or until your health care provider approves. °· Keep all follow-up visits as told by your health care provider. This is important. °Contact a health care provider if: °· Your skin becomes itchy or you develop a rash or hives. °· You have a fever that does not get better with medicine. °· You feel nauseous. °· You vomit. °· You have redness, swelling, or pain around the insertion site. °· You have fluid or blood coming from the insertion site. °· Your insertion area feels warm to the touch. °· You have pus or a bad smell coming from the insertion site. °Get help right away if: °· You have difficulty breathing or  shortness of breath. °· You develop chest pain. °· You faint. °· You feel very dizzy. °These symptoms may represent a serious problem that is an emergency. Do not wait to see if the symptoms will go away. Get medical help right away. Call your local emergency services (911 in the [place] Do not drive yourself to the hospital. °Summary °· After your procedure, it is common to have bruising or mild discomfort in the area where the IV was inserted. °· You should check your IV insertion area every day for signs of infection. °· Take over-the-counter and prescription medicines only as told by your health care provider. °· You should drink a lot of fluids for the first several days after the procedure to help flush the contrast from your body. °This information is not intended to replace advice given to you by your health care provider. Make sure you discuss any questions you have with your health care provider. °Document Released: 05/15/2013 Document Revised: 06/18/2016 Document Reviewed: 06/18/2016 °Elsevier Interactive Patient Education © 2017 [employer] ° °

## 2018-06-15 DIAGNOSIS — E1140 Type 2 diabetes mellitus with diabetic neuropathy, unspecified: Secondary | ICD-10-CM | POA: Diagnosis not present

## 2018-06-18 DIAGNOSIS — G4733 Obstructive sleep apnea (adult) (pediatric): Secondary | ICD-10-CM | POA: Diagnosis not present

## 2018-06-18 DIAGNOSIS — J4530 Mild persistent asthma, uncomplicated: Secondary | ICD-10-CM | POA: Diagnosis not present

## 2018-06-18 DIAGNOSIS — I270 Primary pulmonary hypertension: Secondary | ICD-10-CM | POA: Diagnosis not present

## 2018-06-18 DIAGNOSIS — J918 Pleural effusion in other conditions classified elsewhere: Secondary | ICD-10-CM | POA: Diagnosis not present

## 2018-06-21 DIAGNOSIS — E669 Obesity, unspecified: Secondary | ICD-10-CM | POA: Diagnosis not present

## 2018-06-21 DIAGNOSIS — Z0000 Encounter for general adult medical examination without abnormal findings: Secondary | ICD-10-CM | POA: Diagnosis not present

## 2018-06-21 DIAGNOSIS — Z6833 Body mass index (BMI) 33.0-33.9, adult: Secondary | ICD-10-CM | POA: Diagnosis not present

## 2018-06-21 DIAGNOSIS — Z139 Encounter for screening, unspecified: Secondary | ICD-10-CM | POA: Diagnosis not present

## 2018-06-21 DIAGNOSIS — Z125 Encounter for screening for malignant neoplasm of prostate: Secondary | ICD-10-CM | POA: Diagnosis not present

## 2018-06-21 DIAGNOSIS — Z1339 Encounter for screening examination for other mental health and behavioral disorders: Secondary | ICD-10-CM | POA: Diagnosis not present

## 2018-06-21 DIAGNOSIS — E785 Hyperlipidemia, unspecified: Secondary | ICD-10-CM | POA: Diagnosis not present

## 2018-06-21 DIAGNOSIS — Z136 Encounter for screening for cardiovascular disorders: Secondary | ICD-10-CM | POA: Diagnosis not present

## 2018-06-26 DIAGNOSIS — E1140 Type 2 diabetes mellitus with diabetic neuropathy, unspecified: Secondary | ICD-10-CM | POA: Diagnosis not present

## 2018-06-26 DIAGNOSIS — I4510 Unspecified right bundle-branch block: Secondary | ICD-10-CM | POA: Diagnosis not present

## 2018-06-26 DIAGNOSIS — R9431 Abnormal electrocardiogram [ECG] [EKG]: Secondary | ICD-10-CM | POA: Diagnosis not present

## 2018-06-26 DIAGNOSIS — I440 Atrioventricular block, first degree: Secondary | ICD-10-CM | POA: Diagnosis not present

## 2018-06-26 DIAGNOSIS — I739 Peripheral vascular disease, unspecified: Secondary | ICD-10-CM | POA: Diagnosis not present

## 2018-06-28 DIAGNOSIS — Z923 Personal history of irradiation: Secondary | ICD-10-CM | POA: Diagnosis not present

## 2018-06-28 DIAGNOSIS — F17200 Nicotine dependence, unspecified, uncomplicated: Secondary | ICD-10-CM | POA: Diagnosis present

## 2018-06-28 DIAGNOSIS — C61 Malignant neoplasm of prostate: Secondary | ICD-10-CM | POA: Diagnosis not present

## 2018-06-28 DIAGNOSIS — I10 Essential (primary) hypertension: Secondary | ICD-10-CM | POA: Diagnosis present

## 2018-06-28 DIAGNOSIS — N3040 Irradiation cystitis without hematuria: Secondary | ICD-10-CM | POA: Diagnosis not present

## 2018-06-28 DIAGNOSIS — N138 Other obstructive and reflux uropathy: Secondary | ICD-10-CM | POA: Diagnosis not present

## 2018-06-28 DIAGNOSIS — E119 Type 2 diabetes mellitus without complications: Secondary | ICD-10-CM | POA: Diagnosis present

## 2018-06-28 DIAGNOSIS — N401 Enlarged prostate with lower urinary tract symptoms: Secondary | ICD-10-CM | POA: Diagnosis not present

## 2018-07-10 DIAGNOSIS — F17210 Nicotine dependence, cigarettes, uncomplicated: Secondary | ICD-10-CM | POA: Diagnosis not present

## 2018-07-10 DIAGNOSIS — N183 Chronic kidney disease, stage 3 (moderate): Secondary | ICD-10-CM | POA: Diagnosis not present

## 2018-07-10 DIAGNOSIS — E669 Obesity, unspecified: Secondary | ICD-10-CM | POA: Diagnosis not present

## 2018-07-10 DIAGNOSIS — J90 Pleural effusion, not elsewhere classified: Secondary | ICD-10-CM | POA: Diagnosis not present

## 2018-07-10 DIAGNOSIS — N179 Acute kidney failure, unspecified: Secondary | ICD-10-CM | POA: Diagnosis not present

## 2018-07-10 DIAGNOSIS — E119 Type 2 diabetes mellitus without complications: Secondary | ICD-10-CM | POA: Diagnosis not present

## 2018-07-10 DIAGNOSIS — N189 Chronic kidney disease, unspecified: Secondary | ICD-10-CM | POA: Diagnosis not present

## 2018-07-10 DIAGNOSIS — I110 Hypertensive heart disease with heart failure: Secondary | ICD-10-CM | POA: Diagnosis not present

## 2018-07-10 DIAGNOSIS — J189 Pneumonia, unspecified organism: Secondary | ICD-10-CM | POA: Diagnosis not present

## 2018-07-10 DIAGNOSIS — Z79899 Other long term (current) drug therapy: Secondary | ICD-10-CM | POA: Diagnosis not present

## 2018-07-10 DIAGNOSIS — R0602 Shortness of breath: Secondary | ICD-10-CM | POA: Diagnosis not present

## 2018-07-10 DIAGNOSIS — J449 Chronic obstructive pulmonary disease, unspecified: Secondary | ICD-10-CM | POA: Diagnosis not present

## 2018-07-10 DIAGNOSIS — R0609 Other forms of dyspnea: Secondary | ICD-10-CM | POA: Diagnosis not present

## 2018-07-10 DIAGNOSIS — E1122 Type 2 diabetes mellitus with diabetic chronic kidney disease: Secondary | ICD-10-CM | POA: Diagnosis not present

## 2018-07-10 DIAGNOSIS — I10 Essential (primary) hypertension: Secondary | ICD-10-CM | POA: Diagnosis not present

## 2018-07-10 DIAGNOSIS — C61 Malignant neoplasm of prostate: Secondary | ICD-10-CM | POA: Diagnosis not present

## 2018-07-10 DIAGNOSIS — Z6833 Body mass index (BMI) 33.0-33.9, adult: Secondary | ICD-10-CM | POA: Diagnosis not present

## 2018-07-10 DIAGNOSIS — I509 Heart failure, unspecified: Secondary | ICD-10-CM | POA: Diagnosis not present

## 2018-07-10 DIAGNOSIS — Z794 Long term (current) use of insulin: Secondary | ICD-10-CM | POA: Diagnosis not present

## 2018-07-10 DIAGNOSIS — I129 Hypertensive chronic kidney disease with stage 1 through stage 4 chronic kidney disease, or unspecified chronic kidney disease: Secondary | ICD-10-CM | POA: Diagnosis not present

## 2018-07-10 DIAGNOSIS — D631 Anemia in chronic kidney disease: Secondary | ICD-10-CM | POA: Diagnosis not present

## 2018-07-11 ENCOUNTER — Ambulatory Visit: Payer: Medicare Other | Admitting: Sports Medicine

## 2018-07-11 DIAGNOSIS — J189 Pneumonia, unspecified organism: Secondary | ICD-10-CM | POA: Diagnosis not present

## 2018-07-11 DIAGNOSIS — J90 Pleural effusion, not elsewhere classified: Secondary | ICD-10-CM | POA: Diagnosis not present

## 2018-07-11 DIAGNOSIS — R600 Localized edema: Secondary | ICD-10-CM | POA: Diagnosis not present

## 2018-07-18 DIAGNOSIS — R600 Localized edema: Secondary | ICD-10-CM | POA: Diagnosis not present

## 2018-07-19 DIAGNOSIS — R339 Retention of urine, unspecified: Secondary | ICD-10-CM | POA: Diagnosis not present

## 2018-07-19 DIAGNOSIS — R300 Dysuria: Secondary | ICD-10-CM | POA: Diagnosis not present

## 2018-07-19 DIAGNOSIS — R338 Other retention of urine: Secondary | ICD-10-CM | POA: Diagnosis not present

## 2018-07-19 DIAGNOSIS — C675 Malignant neoplasm of bladder neck: Secondary | ICD-10-CM | POA: Diagnosis not present

## 2018-07-20 ENCOUNTER — Ambulatory Visit (INDEPENDENT_AMBULATORY_CARE_PROVIDER_SITE_OTHER): Payer: Medicare Other | Admitting: Sports Medicine

## 2018-07-20 ENCOUNTER — Encounter: Payer: Self-pay | Admitting: Sports Medicine

## 2018-07-20 VITALS — BP 147/73 | HR 57 | Resp 16

## 2018-07-20 DIAGNOSIS — B351 Tinea unguium: Secondary | ICD-10-CM

## 2018-07-20 DIAGNOSIS — E1140 Type 2 diabetes mellitus with diabetic neuropathy, unspecified: Secondary | ICD-10-CM | POA: Diagnosis not present

## 2018-07-20 DIAGNOSIS — I739 Peripheral vascular disease, unspecified: Secondary | ICD-10-CM

## 2018-07-20 DIAGNOSIS — M79676 Pain in unspecified toe(s): Secondary | ICD-10-CM

## 2018-07-20 NOTE — Progress Notes (Signed)
Subjective: [name] is a 83 y.o. male patient with history of diabetes who presents to office today complaining of long, painful nails  while ambulating in shoes; unable to trim. Patient states that the glucose reading this morning was 101. Saw PCP, Dr. Delena Bali a few days ago, last A1c was 6.1.  Patient reports that he is having problems with his bladder had a procedure where they found some cells that are considered cancerous.  Patient reports that there was a change with his blood pressure medication but does not recall which medication at this time and will bring medication list next visit. Patient denies any other issues.    Patient Active Problem List   Diagnosis Date Noted  . S/P lumbar laminectomy 09/12/2014  . Prostate cancer ([place]) 03/10/2011  . Hypertension 03/10/2011  . High cholesterol 03/10/2011  . Kidney stones 03/10/2011  . Diabetic neuropathy ([place]) 03/10/2011  . Neuritis due to diabetes mellitus ([place]) 03/10/2011   Current Outpatient Medications on File Prior to Visit  Medication Sig Dispense Refill  . acetaminophen (TYLENOL) 500 MG tablet Take 1,000 mg by mouth every 6 (six) hours as needed for moderate pain or headache.     . albuterol (PROVENTIL) (2.5 MG/3ML) 0.083% nebulizer solution Take 2.5 mg by nebulization every 6 (six) hours as needed for wheezing or shortness of breath.    Marland Kitchen amLODipine (NORVASC) 10 MG tablet Take 10 mg by mouth daily.  5  . atorvastatin (LIPITOR) 10 MG tablet Take 10 mg by mouth at bedtime.    . citalopram (CELEXA) 40 MG tablet Take 40 mg by mouth daily.     . diphenhydrAMINE (BENADRYL) 25 MG tablet Take 25 mg by mouth daily as needed for allergies.    . fluticasone furoate-vilanterol (BREO ELLIPTA) 100-25 MCG/INH AEPB Inhale 1 puff into the lungs daily.    . furosemide (LASIX) 40 MG tablet Take 40 mg by mouth.    . gabapentin (NEURONTIN) 300 MG capsule Take 100 mg by mouth 2 (two) times daily.     . insulin glargine (LANTUS) 100 UNIT/ML  injection Inject 10 Units into the skin 2 (two) times daily.     . insulin lispro (HUMALOG) 100 UNIT/ML injection Inject 5-15 Units into the skin 3 (three) times daily before meals. Based on sliding scale     . metoprolol succinate (TOPROL-XL) 25 MG 24 hr tablet Take 25 mg by mouth daily.  2  . phenazopyridine (PYRIDIUM) 100 MG tablet Take 100 mg by mouth 3 (three) times daily as needed for pain.  0  . tamsulosin (FLOMAX) 0.4 MG CAPS capsule Take 0.4 mg by mouth daily.    . temazepam (RESTORIL) 30 MG capsule Take 30 mg by mouth at bedtime as needed for sleep.      No current facility-administered medications on file prior to visit.    Allergies  Allergen Reactions  . Oxycodone-Acetaminophen Anxiety and Other (See Comments)    NERVOUS AND SHAKY     Recent Results (from the past 2160 hour(s))  Pulmonary Function Test     Status: None   Collection Time: 05/01/18  8:58 AM  Result Value Ref Range   FEV1     FVC     FEV1/FVC     TLC     DLCO    INR/PT     Status: None   Collection Time: 05/22/18 10:00 AM  Result Value Ref Range   Prothrombin Time 14.8 11.4 - 15.2 seconds   INR 1.17  Comment: Performed at [facility], [place] [address], [place], [place] [address]  Basic Metabolic Panel (BMET)     Status: Abnormal   Collection Time: 05/22/18 10:00 AM  Result Value Ref Range   Sodium 137 135 - 145 mmol/L   Potassium 3.8 3.5 - 5.1 mmol/L   Chloride 110 98 - 111 mmol/L   CO2 20 (L) 22 - 32 mmol/L   Glucose, Bld 51 (L) 70 - 99 mg/dL   BUN 36 (H) 8 - 23 mg/dL   Creatinine, Ser 2.08 (H) 0.61 - 1.24 mg/dL   Calcium 8.7 (L) 8.9 - 10.3 mg/dL   GFR calc non Af Amer 28 (L) >60 mL/min   GFR calc Af Amer 33 (L) >60 mL/min    Comment: (NOTE) The eGFR has been calculated using the CKD EPI equation. This calculation has not been validated in all clinical situations. eGFR's persistently <60 mL/min signify possible Chronic Kidney Disease.    Anion gap 7 5 - 15    Comment: Performed at  [facility] [address] [place], [place] [address]  CBC     Status: Abnormal   Collection Time: 05/22/18 10:00 AM  Result Value Ref Range   WBC 8.8 4.0 - 10.5 K/uL   RBC 3.67 (L) 4.22 - 5.81 MIL/uL   Hemoglobin 11.0 (L) 13.0 - 17.0 g/dL   HCT 34.1 (L) 39.0 - 52.0 %   MCV 92.9 80.0 - 100.0 fL   MCH 30.0 26.0 - 34.0 pg   MCHC 32.3 30.0 - 36.0 g/dL   RDW 12.7 11.5 - 15.5 %   Platelets 149 (L) 150 - 400 K/uL   nRBC 0.0 0.0 - 0.2 %    Comment: Performed at [facility], [place] [address], [place], [place] [address]  Glucose, capillary     Status: None   Collection Time: 06/11/18  7:18 AM  Result Value Ref Range   Glucose-Capillary 92 70 - 99 mg/dL  Basic metabolic panel     Status: Abnormal   Collection Time: 06/11/18  7:42 AM  Result Value Ref Range   Sodium 137 135 - 145 mmol/L   Potassium 4.5 3.5 - 5.1 mmol/L   Chloride 115 (H) 98 - 111 mmol/L   CO2 18 (L) 22 - 32 mmol/L   Glucose, Bld 104 (H) 70 - 99 mg/dL   BUN 31 (H) 8 - 23 mg/dL   Creatinine, Ser 2.40 (H) 0.61 - 1.24 mg/dL   Calcium 8.4 (L) 8.9 - 10.3 mg/dL   GFR calc non Af Amer 24 (L) >60 mL/min   GFR calc Af Amer 28 (L) >60 mL/min    Comment: (NOTE) The eGFR has been calculated using the CKD EPI equation. This calculation has not been validated in all clinical situations. eGFR's persistently <60 mL/min signify possible Chronic Kidney Disease.    Anion gap 4 (L) 5 - 15    Comment: Performed at [facility] [address], [place], [place] [address]  Glucose, capillary     Status: Abnormal   Collection Time: 06/11/18  9:59 AM  Result Value Ref Range   Glucose-Capillary 116 (H) 70 - 99 mg/dL  I-STAT 3, venous blood gas (G3P V)     Status: Abnormal   Collection Time: 06/11/18 10:30 AM  Result Value Ref Range   pH, Ven 7.282 7.250 - 7.430   pCO2, Ven 36.1 (L) 44.0 - 60.0 mmHg   pO2, Ven 35.0 32.0 -  45.0 mmHg   Bicarbonate 17.0 (L) 20.0 - 28.0 mmol/L   TCO2 18 (L) 22 - 32 mmol/L   O2 Saturation  61.0 %   Acid-base deficit 9.0 (H) 0.0 - 2.0 mmol/L   Patient temperature HIDE    Sample type VENOUS    Comment NOTIFIED PHYSICIAN   I-STAT 3, venous blood gas (G3P V)     Status: Abnormal   Collection Time: 06/11/18 10:30 AM  Result Value Ref Range   pH, Ven 7.287 7.250 - 7.430   pCO2, Ven 35.9 (L) 44.0 - 60.0 mmHg   pO2, Ven 35.0 32.0 - 45.0 mmHg   Bicarbonate 17.1 (L) 20.0 - 28.0 mmol/L   TCO2 18 (L) 22 - 32 mmol/L   O2 Saturation 62.0 %   Acid-base deficit 9.0 (H) 0.0 - 2.0 mmol/L   Patient temperature HIDE    Sample type VENOUS    Comment NOTIFIED PHYSICIAN     Objective: General: Patient is awake, alert, and oriented x 3 and in no acute distress.  Integument: Skin is warm, dry and supple bilateral. Nails are tender, long, thickened and dystrophic with subungual debris, consistent with onychomycosis, 1-5 bilateral. No signs of infection. No open lesions or preulcerative lesions present bilateral. Remaining integument unremarkable.  Vasculature:  Dorsalis Pedis pulse 1/4 bilateral. Posterior Tibial pulse  0/4 bilateral. Capillary fill time <5 sec 1-5 bilateral. No hair growth to the level of the digits.Temperature gradient within normal limits. Mild varicosities present bilateral. 1+ pitting edema present bilateral.   Neurology: The patient has intact sensation measured with a 5.07/10g Semmes Weinstein Monofilament at all pedal sites bilateral. Vibratory sensation diminished bilateral with tuning fork. No Babinski sign present bilateral.   Musculoskeletal: Asymptomatic hammertoe pedal deformities noted bilateral. Muscular strength 5/5 in all lower extremity muscular groups bilateral without pain on range of motion . No tenderness with calf compression bilateral.  Assessment and Plan: Problem List Items Addressed This Visit    None    Visit Diagnoses    Pain due to onychomycosis of toenail    -  Primary   Relevant Medications   cefdinir (OMNICEF) 300 MG capsule    sulfamethoxazole-trimethoprim (BACTRIM DS,SEPTRA DS) 800-160 MG tablet   Type 2 diabetes, controlled, with neuropathy ([facility])       PVD (peripheral vascular disease) ([place])          -Examined patient. -Discussed and educated patient on diabetic foot care, especially with  regards to the vascular, neurological and musculoskeletal systems.  -Mechanically debrided all nails 1-5 bilateral using sterile nail nipper and filed with dremel without incident  -Patient to return in 3 months for at risk foot care -Patient advised to call the office if any problems or questions arise in the meantime.  Landis Martins, DPM

## 2018-08-14 DIAGNOSIS — R339 Retention of urine, unspecified: Secondary | ICD-10-CM | POA: Diagnosis not present

## 2018-08-17 DIAGNOSIS — E785 Hyperlipidemia, unspecified: Secondary | ICD-10-CM | POA: Diagnosis not present

## 2018-08-17 DIAGNOSIS — N183 Chronic kidney disease, stage 3 (moderate): Secondary | ICD-10-CM | POA: Diagnosis not present

## 2018-08-17 DIAGNOSIS — D638 Anemia in other chronic diseases classified elsewhere: Secondary | ICD-10-CM | POA: Diagnosis not present

## 2018-08-17 DIAGNOSIS — I10 Essential (primary) hypertension: Secondary | ICD-10-CM | POA: Diagnosis not present

## 2018-08-17 DIAGNOSIS — E1142 Type 2 diabetes mellitus with diabetic polyneuropathy: Secondary | ICD-10-CM | POA: Diagnosis not present

## 2018-08-21 DIAGNOSIS — Z8546 Personal history of malignant neoplasm of prostate: Secondary | ICD-10-CM | POA: Diagnosis not present

## 2018-08-21 DIAGNOSIS — I700 Atherosclerosis of aorta: Secondary | ICD-10-CM | POA: Diagnosis not present

## 2018-08-21 DIAGNOSIS — R338 Other retention of urine: Secondary | ICD-10-CM | POA: Diagnosis not present

## 2018-08-21 DIAGNOSIS — R300 Dysuria: Secondary | ICD-10-CM | POA: Diagnosis not present

## 2018-08-21 DIAGNOSIS — Z8559 Personal history of malignant neoplasm of other urinary tract organ: Secondary | ICD-10-CM | POA: Diagnosis not present

## 2018-08-21 DIAGNOSIS — K7460 Unspecified cirrhosis of liver: Secondary | ICD-10-CM | POA: Diagnosis not present

## 2018-08-21 DIAGNOSIS — Z8551 Personal history of malignant neoplasm of bladder: Secondary | ICD-10-CM | POA: Diagnosis not present

## 2018-08-21 DIAGNOSIS — J90 Pleural effusion, not elsewhere classified: Secondary | ICD-10-CM | POA: Diagnosis not present

## 2018-08-21 DIAGNOSIS — C679 Malignant neoplasm of bladder, unspecified: Secondary | ICD-10-CM | POA: Diagnosis not present

## 2018-09-03 DIAGNOSIS — J90 Pleural effusion, not elsewhere classified: Secondary | ICD-10-CM | POA: Diagnosis not present

## 2018-09-04 DIAGNOSIS — C675 Malignant neoplasm of bladder neck: Secondary | ICD-10-CM | POA: Diagnosis present

## 2018-09-04 DIAGNOSIS — E7800 Pure hypercholesterolemia, unspecified: Secondary | ICD-10-CM | POA: Diagnosis present

## 2018-09-04 DIAGNOSIS — N320 Bladder-neck obstruction: Secondary | ICD-10-CM | POA: Diagnosis present

## 2018-09-04 DIAGNOSIS — I10 Essential (primary) hypertension: Secondary | ICD-10-CM | POA: Diagnosis present

## 2018-09-04 DIAGNOSIS — Z9079 Acquired absence of other genital organ(s): Secondary | ICD-10-CM | POA: Diagnosis not present

## 2018-09-04 DIAGNOSIS — R338 Other retention of urine: Secondary | ICD-10-CM | POA: Diagnosis present

## 2018-09-04 DIAGNOSIS — D400 Neoplasm of uncertain behavior of prostate: Secondary | ICD-10-CM | POA: Diagnosis not present

## 2018-09-04 DIAGNOSIS — Z87891 Personal history of nicotine dependence: Secondary | ICD-10-CM | POA: Diagnosis not present

## 2018-09-04 DIAGNOSIS — C679 Malignant neoplasm of bladder, unspecified: Secondary | ICD-10-CM | POA: Diagnosis not present

## 2018-09-04 DIAGNOSIS — N138 Other obstructive and reflux uropathy: Secondary | ICD-10-CM | POA: Diagnosis not present

## 2018-09-04 DIAGNOSIS — R300 Dysuria: Secondary | ICD-10-CM | POA: Diagnosis not present

## 2018-09-04 DIAGNOSIS — Z9181 History of falling: Secondary | ICD-10-CM | POA: Diagnosis not present

## 2018-09-04 DIAGNOSIS — H9190 Unspecified hearing loss, unspecified ear: Secondary | ICD-10-CM | POA: Diagnosis present

## 2018-09-04 DIAGNOSIS — Z8546 Personal history of malignant neoplasm of prostate: Secondary | ICD-10-CM | POA: Diagnosis not present

## 2018-09-04 DIAGNOSIS — E1151 Type 2 diabetes mellitus with diabetic peripheral angiopathy without gangrene: Secondary | ICD-10-CM | POA: Diagnosis present

## 2018-09-04 DIAGNOSIS — Z923 Personal history of irradiation: Secondary | ICD-10-CM | POA: Diagnosis not present

## 2018-09-04 DIAGNOSIS — K7460 Unspecified cirrhosis of liver: Secondary | ICD-10-CM | POA: Diagnosis present

## 2018-09-12 DIAGNOSIS — C675 Malignant neoplasm of bladder neck: Secondary | ICD-10-CM | POA: Diagnosis not present

## 2018-09-12 DIAGNOSIS — R300 Dysuria: Secondary | ICD-10-CM | POA: Diagnosis not present

## 2018-09-12 DIAGNOSIS — C61 Malignant neoplasm of prostate: Secondary | ICD-10-CM | POA: Diagnosis not present

## 2018-09-12 DIAGNOSIS — R338 Other retention of urine: Secondary | ICD-10-CM | POA: Diagnosis not present

## 2018-10-05 DIAGNOSIS — R300 Dysuria: Secondary | ICD-10-CM | POA: Diagnosis not present

## 2018-10-05 DIAGNOSIS — N390 Urinary tract infection, site not specified: Secondary | ICD-10-CM | POA: Diagnosis not present

## 2018-10-05 DIAGNOSIS — C679 Malignant neoplasm of bladder, unspecified: Secondary | ICD-10-CM | POA: Diagnosis not present

## 2018-10-05 DIAGNOSIS — R319 Hematuria, unspecified: Secondary | ICD-10-CM | POA: Diagnosis not present

## 2018-10-05 DIAGNOSIS — R8290 Unspecified abnormal findings in urine: Secondary | ICD-10-CM | POA: Diagnosis not present

## 2018-10-19 ENCOUNTER — Ambulatory Visit: Payer: Medicare Other | Admitting: Sports Medicine

## 2018-11-07 DEATH — deceased
